# Patient Record
Sex: Female | Born: 1980 | Race: White | Hispanic: No | Marital: Married | Smoking: Current some day smoker
Health system: Southern US, Community
[De-identification: ages and names within clinical notes are randomized; demographics above are authoritative.]

## PROBLEM LIST (undated history)

## (undated) DIAGNOSIS — J45909 Unspecified asthma, uncomplicated: Secondary | ICD-10-CM

## (undated) DIAGNOSIS — Z9889 Other specified postprocedural states: Secondary | ICD-10-CM

## (undated) DIAGNOSIS — K219 Gastro-esophageal reflux disease without esophagitis: Secondary | ICD-10-CM

## (undated) DIAGNOSIS — R112 Nausea with vomiting, unspecified: Secondary | ICD-10-CM

## (undated) DIAGNOSIS — C801 Malignant (primary) neoplasm, unspecified: Secondary | ICD-10-CM

## (undated) HISTORY — PX: OTHER SURGICAL HISTORY: SHX169

## (undated) HISTORY — PX: TUBAL LIGATION: SHX77

## (undated) HISTORY — PX: BACK SURGERY: SHX140

## (undated) HISTORY — PX: BREAST ENHANCEMENT SURGERY: SHX7

---

## 2015-11-21 ENCOUNTER — Other Ambulatory Visit (HOSPITAL_COMMUNITY): Payer: Self-pay | Admitting: General Surgery

## 2015-11-30 ENCOUNTER — Other Ambulatory Visit: Payer: Self-pay

## 2015-11-30 ENCOUNTER — Ambulatory Visit (HOSPITAL_COMMUNITY)
Admission: RE | Admit: 2015-11-30 | Discharge: 2015-11-30 | Disposition: A | Discharge status: Home / Self Care | Payer: BLUE CROSS/BLUE SHIELD | Source: Ambulatory Visit | Attending: General Surgery | Admitting: General Surgery

## 2015-12-08 ENCOUNTER — Encounter: Payer: BLUE CROSS/BLUE SHIELD | Attending: General Surgery | Admitting: Dietician

## 2015-12-08 DIAGNOSIS — Z6841 Body Mass Index (BMI) 40.0 and over, adult: Secondary | ICD-10-CM | POA: Insufficient documentation

## 2015-12-08 NOTE — Progress Notes (Signed)
  Pre-Op Assessment Visit:  Pre-Operative Sleeve gastrectomy Surgery  Medical Nutrition Therapy:  Appt start time: 155   End time:  230.  Patient was seen on 12/08/2015 for Pre-Operative Nutrition Assessment. Assessment and letter of approval faxed to [facility] Surgery Bariatric Surgery Program coordinator on 12/08/2015.   Samples provided and patient instructed on proper use: Bariatric Advantage Calcium citrate chew (coconut - qty 2) Lot#: [id number] Exp: 02/2016  Bariatric Advantage Calcium citrate chew (caramel - qty 2) Lot#: [id number] Exp: 06/2016   Bariatric Advantage Calcium citrate chew (tropical orange - qty 2) Lot#: [id number] Exp: 03/2016  Bariatric Advantage Calcium citrate chew (chocolate - qty 2) Lot#: [id number] Exp: 06/2016  Bariatric Advantage Calcium citrate chew (strawberry - qty 2) Lot#: [id number] Exp: 07/2016  Bariatric Advantage Calcium citrate chew (chocolate peanut butter - qty 2) Lot#: [id number] Exp: 07/2016  Preferred Learning Style:   No preference indicated   Learning Readiness:   Ready  Handouts given during visit include:  Pre-Op Goals Bariatric Surgery Protein Shakes   During the appointment today the following Pre-Op Goals were reviewed with the patient: Maintain or lose weight as instructed by your surgeon Make healthy food choices Begin to limit portion sizes Limited concentrated sugars and fried foods Keep fat/sugar in the single digits per serving on   food labels Practice CHEWING your food  (aim for 30 chews per bite or until applesauce consistency) Practice not drinking 15 minutes before, during, and 30 minutes after each meal/snack Avoid all carbonated beverages  Avoid/limit caffeinated beverages  Avoid all sugar-sweetened beverages Consume 3 meals per day; eat every 3-5 hours Make a list of non-food related activities Aim for 64-100 ounces of FLUID daily  Aim for at least 60-80 grams of PROTEIN daily Look for a liquid protein  source that contain ?15 g protein and ?5 g carbohydrate  (ex: shakes, drinks, shots)  Patient-Centered Goals: -Feeling better -More energy and mobility -Reduced back pain  Scale of 1-10: confidence (8) /importance (10)  Demonstrated degree of understanding via:  Teach Back  Teaching Method Utilized:  Visual Auditory Hands on  Barriers to learning/adherence to lifestyle change: none  Patient to call the [facility] to enroll in Pre-Op and Post-Op Nutrition Education when surgery date is scheduled.

## 2015-12-08 NOTE — Patient Instructions (Signed)
Follow Pre-Op Goals Try Protein Shakes Call [facility] at [phone] when surgery is scheduled to enroll in Pre-Op Class  Things to remember:  Please always be honest with [place]. We want to support you!  If you have any questions or concerns in between appointments, please call or email Ferol Luz, or Margarita Grizzle.  The diet after surgery will be high protein and low in carbohydrate.  Vitamins and calcium need to be taken for the rest of your life.  Feel free to include support people in any classes or appointments.   Supplement recommendations:  "Complete" Multivitamin: Sleeve Gastrectomy and RYGB patients take a double dose of MVI. Vitamin must be liquid or chewable but not gummy. Examples of these include Flintstones Complete and Centrum Complete. If the vitamin is bariatric-specific, take 1 dose as it is already formulated for bariatric surgery patients. Examples of these are Bariatric Advantage, Celebrate, and [employer]. These can be found at the [facility] and/or online.     Calcium citrate: 1500 mg/day of Calcium citrate (also chewable or liquid) is recommended for all procedures. The body is only able to absorb 500-600 mg of Calcium at one time so 3 daily doses of 500 mg are recommended. Calcium doses must be taken a minimum of 2 hours apart. Additionally, Calcium must be taken 2 hours apart from iron-containing MVI. Examples of brands include Celebrate, Bariatric Advantage, and Wellesse. These brands must be purchased online or at the [facility]. Citracal Petites is the only Calcium citrate supplement found in general grocery stores and pharmacies. This is in tablet form and may be recommended for patients who do not tolerate chewable Calcium.  Continued or added Vitamin D supplementation based on individual needs.    Vitamin B12: 300-500 mcg/day for Sleeve Gastrectomy and RYGB. Must be taken intramuscularly, sublingually, or inhaled nasally. Oral route  is not recommended.

## 2015-12-09 ENCOUNTER — Ambulatory Visit: Payer: Self-pay | Admitting: Dietician

## 2016-01-05 ENCOUNTER — Encounter: Payer: BLUE CROSS/BLUE SHIELD | Attending: General Surgery | Admitting: Dietician

## 2016-01-05 ENCOUNTER — Encounter: Payer: Self-pay | Admitting: Dietician

## 2016-01-05 DIAGNOSIS — Z6841 Body Mass Index (BMI) 40.0 and over, adult: Secondary | ICD-10-CM | POA: Insufficient documentation

## 2016-01-05 NOTE — Patient Instructions (Signed)
-  Work on chewing well, eating slowly, and taking tiny bites -Practice eating at least 3x a day  -Start thinking about including a protein food with each meal and snack

## 2016-01-05 NOTE — Progress Notes (Signed)
Supervised weight loss:  Appt start time: 1030 end time:  1045  SWL visit 1:  Primary concerns today: [place] returns today for her 1st SWL visit in preparation for sleeve gastrectomy. She reports she was afraid she had gained weight because she stopped smoking 9 days ago. She also stopped drinking soda. Craving crunchy foods. Practicing eating regularly throughout the day and has been walking more. Trying to be mindful of eating slowly, taking tiny bites, and chewing thoroughly. Struggling not to drink while eating.    Weight: 237.5 lbs BMI: 40.9  Patient-Centered Goals: -Feeling better -More energy and mobility -Reduced back pain  Scale of 1-10: confidence (8) /importance (10)  MEDICATIONS: see list  DIETARY INTAKE:  24-hr recall:  B ( AM): eggs and bacon or sausage  Snk ( AM): fruit  L ( PM): crackers   Snk ( PM):  D ( PM): chicken, green beans, and corn or mac and cheese  Snk ( PM):   Beverages: water, sugar free Nature's Twist   Recent physical activity: walking 15 minutes 3x a week  Estimated energy needs: 1600-1800 calories  Progress Towards Goal(s):  In progress.   Nutritional Diagnosis:  [place]-3.3 Overweight/obesity related to past poor dietary habits and physical inactivity as evidenced by patient in SWL for pending bariatric surgery following dietary guidelines for continued weight loss.     Intervention:  Nutrition counseling provided.  Handouts given during visit include:  none  Monitoring/Evaluation:  Dietary intake, exercise, and body weight in 4 week(s).

## 2016-02-02 ENCOUNTER — Encounter: Payer: BLUE CROSS/BLUE SHIELD | Attending: General Surgery | Admitting: Dietician

## 2016-02-02 ENCOUNTER — Encounter: Payer: Self-pay | Admitting: Dietician

## 2016-02-02 DIAGNOSIS — Z6841 Body Mass Index (BMI) 40.0 and over, adult: Secondary | ICD-10-CM | POA: Insufficient documentation

## 2016-02-02 NOTE — Progress Notes (Signed)
Supervised weight loss:  Appt start time: 950 end time:  1005  SWL visit 2:  Primary concerns today: Tracie Johnson returns today for her 2nd SWL visit in preparation for sleeve gastrectomy. She has continued to avoid sodas, caffeine, and cigarettes. Still craving crunchy foods. Tried premier protein shake and likes the chocolate flavor; plans to try the clear version. Has been practicing chewing very thoroughly. Struggling to eat regularly throughout the day and has not been exercising as much due to busy schedule.   Weight: 238.5 lbs BMI: 41  Patient-Centered Goals: -Feeling better -More energy and mobility -Reduced back pain  Scale of 1-10: confidence (8) /importance (10)  MEDICATIONS: see list  DIETARY INTAKE:  24-hr recall:  B ( AM): eggs and bacon or sausage  Snk ( AM): fruit  L ( PM): crackers, pepperoni Snk ( PM):  D ( PM): chicken, green beans, and corn or mac and cheese  Snk ( PM):   Beverages: water, sugar free Nature's Twist   Recent physical activity: walking 15 minutes 3x a week  Estimated energy needs: 1600-1800 calories  Progress Towards Goal(s):  In progress.   Nutritional Diagnosis:  [place]-3.3 Overweight/obesity related to past poor dietary habits and physical inactivity as evidenced by patient in SWL for pending bariatric surgery following dietary guidelines for continued weight loss.     Intervention:  Nutrition counseling provided.  Goals: -Work on chewing well, eating slowly, and taking tiny bites -Practice eating at least 3x a day  -Start thinking about including a protein food with each meal and snack  -Try Quest protein chips and parmesan wisps for a crunch -Practice getting into the mindset of diet after surgery  Handouts given during visit include:  none  Monitoring/Evaluation:  Dietary intake, exercise, and body weight in 4 week(s).

## 2016-02-02 NOTE — Patient Instructions (Addendum)
-  Work on chewing well, eating slowly, and taking tiny bites -Practice eating at least 3x a day  -Start thinking about including a protein food with each meal and snack   -Try Quest protein chips and parmesan wisps for a crunch  -Practice getting into the mindset of diet after surgery

## 2016-03-01 ENCOUNTER — Encounter: Payer: Self-pay | Admitting: Dietician

## 2016-03-01 ENCOUNTER — Encounter: Payer: BLUE CROSS/BLUE SHIELD | Attending: General Surgery | Admitting: Dietician

## 2016-03-01 DIAGNOSIS — Z6841 Body Mass Index (BMI) 40.0 and over, adult: Secondary | ICD-10-CM | POA: Insufficient documentation

## 2016-03-01 NOTE — Progress Notes (Signed)
Supervised weight loss:  Appt start time: 1010 end time:  1025  SWL visit 3:  Primary concerns today: Tracie Johnson returns today for her 3rd SWL visit in preparation for sleeve gastrectomy having gained 2 pounds. States she has been struggling with constipation. She has continued to avoid caffeine and sodas. Has not yet tried Premier clear protein shakes. Continues to practice chewing thoroughly. Has been vacationing and has not been cooking as much.   Weight: 240.8 lbs BMI: 41.4  Patient-Centered Goals: -Feeling better -More energy and mobility -Reduced back pain  Scale of 1-10: confidence (8) /importance (10)  MEDICATIONS: see list  DIETARY INTAKE:  24-hr recall:  B ( AM): eggs and bacon or sausage  Snk ( AM): fruit  L ( PM): crackers, pepperoni Snk ( PM):  D ( PM): chicken, green beans, and corn or mac and cheese  Snk ( PM):   Beverages: water, sugar free Nature's Twist   Recent physical activity: walking 15 minutes 3x a week  Estimated energy needs: 1600-1800 calories  Progress Towards Goal(s):  In progress.   Nutritional Diagnosis:  [place]-3.3 Overweight/obesity related to past poor dietary habits and physical inactivity as evidenced by patient in SWL for pending bariatric surgery following dietary guidelines for continued weight loss.     Intervention:  Nutrition counseling provided.  Goals: -Work on chewing well, eating slowly, and taking tiny bites -Practice eating at least 3x a day  -Start thinking about including a protein food with each meal and snack  -Try Quest protein chips and parmesan wisps for a crunch -Practice getting into the mindset of diet after surgery  Handouts given during visit include:  none  Monitoring/Evaluation:  Dietary intake, exercise, and body weight in 4 week(s).

## 2016-03-01 NOTE — Patient Instructions (Addendum)
-  Work on chewing well, eating slowly, and taking tiny bites -Practice eating at least 3x a day  -Start thinking about including a protein food with each meal and snack   -Try Quest protein chips and parmesan wisps for a crunch  -Work on drinking more water  -Go for an actual walk on [employer] 2 times on vacation  -Practice getting into the mindset of diet after surgery

## 2016-03-28 ENCOUNTER — Encounter: Payer: Self-pay | Admitting: Dietician

## 2016-03-28 ENCOUNTER — Encounter: Payer: BLUE CROSS/BLUE SHIELD | Attending: General Surgery | Admitting: Dietician

## 2016-03-28 DIAGNOSIS — Z6841 Body Mass Index (BMI) 40.0 and over, adult: Secondary | ICD-10-CM | POA: Insufficient documentation

## 2016-03-28 NOTE — Progress Notes (Signed)
Supervised weight loss:  Appt start time: 1045 end time:  1100  SWL visit 4:  Primary concerns today: Tracie Johnson returns today for her 4th SWL visit in preparation for sleeve gastrectomy having maintained her weight. Has had a death in the family and went on vacation in the last month. However, she has not had a cigarette! She states that she walked a lot on vacation. Has continued to drink a lot of water. Has not yet tried a protein shake.  Weight: 240.3 lbs BMI: 41.4  Patient-Centered Goals: -Feeling better -More energy and mobility -Reduced back pain  Scale of 1-10: confidence (8) /importance (10)  MEDICATIONS: see list  DIETARY INTAKE:  24-hr recall:  B ( AM): eggs and bacon or sausage  Snk ( AM): fruit  L ( PM): crackers, pepperoni Snk ( PM):  D ( PM): chicken, green beans, and corn or mac and cheese  Snk ( PM):   Beverages: water, sugar free Nature's Twist   Recent physical activity: walking 15 minutes 3x a week  Estimated energy needs: 1600-1800 calories  Progress Towards Goal(s):  In progress.   Nutritional Diagnosis:  [place]-3.3 Overweight/obesity related to past poor dietary habits and physical inactivity as evidenced by patient in SWL for pending bariatric surgery following dietary guidelines for continued weight loss.     Intervention:  Nutrition counseling provided.  Goals: -Work on chewing well, eating slowly, and taking tiny bites -Practice eating at least 3x a day  -Start thinking about including a protein food with each meal and snack  -Try Quest protein chips and parmesan wisps for a crunch -Practice getting into the mindset of diet after surgery -Eliminate caffeine -Practice eating more slowly and CHEW! -Try an approved protein shake  Handouts given during visit include:  none  Monitoring/Evaluation:  Dietary intake, exercise, and body weight in 4 week(s).

## 2016-03-28 NOTE — Patient Instructions (Addendum)
-  Work on chewing well, eating slowly, and taking tiny bites -Practice eating at least 3x a day  -Start thinking about including a protein food with each meal and snack  -Try Quest protein chips and parmesan wisps for a crunch -Work on drinking more water -[place] -Practice getting into the mindset of diet after surgery  -Eliminate caffeine  -Practice eating more slowly and CHEW!  -Try an approved protein shake

## 2016-03-29 ENCOUNTER — Ambulatory Visit: Payer: BLUE CROSS/BLUE SHIELD | Admitting: Dietician

## 2016-04-30 ENCOUNTER — Encounter: Payer: BLUE CROSS/BLUE SHIELD | Attending: General Surgery | Admitting: Dietician

## 2016-04-30 ENCOUNTER — Encounter: Payer: Self-pay | Admitting: Dietician

## 2016-04-30 DIAGNOSIS — Z6841 Body Mass Index (BMI) 40.0 and over, adult: Secondary | ICD-10-CM | POA: Insufficient documentation

## 2016-04-30 NOTE — Progress Notes (Signed)
Supervised weight loss:  Appt start time: 1025 end time:  1040  SWL visit 5:  Primary concerns today: Tracie Johnson returns today for her final SWL visit in preparation for sleeve gastrectomy having gained 2 pounds. She is still struggling to chew thoroughly and make this a habit. She continues to avoid cigarettes and caffeine. She likes lean protein foods and is prepared for the post op diet. She prefers to cook rather than eat out. She feels like eggs will be her go-to after surgery if she does not want to eat what she makes for her family. Tried Premier protein shake and likes it. Still working on not drinking with meals.    Weight: 242.3 lbs BMI: 41.6  Patient-Centered Goals: -Feeling better -More energy and mobility -Reduced back pain  Scale of 1-10: confidence (8) /importance (10)  MEDICATIONS: see list  DIETARY INTAKE:  24-hr recall:  B ( AM): eggs and bacon or sausage  Snk ( AM): fruit  L ( PM): crackers, pepperoni Snk ( PM):  D ( PM): chicken, green beans, and corn or mac and cheese  Snk ( PM):   Beverages: water, sugar free Nature's Twist   Recent physical activity: walking 15 minutes 3x a week  Estimated energy needs: 1600-1800 calories  Progress Towards Goal(s):  In progress.   Nutritional Diagnosis:  [place]-3.3 Overweight/obesity related to past poor dietary habits and physical inactivity as evidenced by patient in SWL for pending bariatric surgery following dietary guidelines for continued weight loss.     Intervention:  Nutrition counseling provided.  Goals: -Work on chewing well, eating slowly, and taking tiny bites -Practice eating at least 3x a day  -Start thinking about including a protein food with each meal and snack  -Try Quest protein chips and parmesan wisps for a crunch -Practice getting into the mindset of diet after surgery -Eliminate caffeine -Practice eating more slowly and CHEW! -Try an approved protein shake  Handouts given during visit  include:  none  Monitoring/Evaluation:  Dietary intake, exercise, and body weight in 4 week(s).

## 2016-05-07 ENCOUNTER — Encounter: Payer: BLUE CROSS/BLUE SHIELD | Admitting: Dietician

## 2016-05-08 NOTE — Progress Notes (Signed)
Pt coming in for pre op-  PLEASE PLACE PRE OP ORDERS IN EPIC  THANKS

## 2016-05-09 ENCOUNTER — Encounter: Payer: Self-pay | Admitting: Dietician

## 2016-05-09 NOTE — Progress Notes (Signed)
  Pre-Operative Nutrition Class:  Appt start time: 2774   End time:  1830.  Patient was seen on 05/07/2016 for Pre-Operative Bariatric Surgery Education at [facility].   Surgery date:  Surgery type: Sleeve Gastrectomy [facility] weight at [facility]: 239 lbs on 12/08/2015 Weight today: 240.8 lbs  TANITA  BODY COMP RESULTS  05/09/16   BMI (kg/m^2) 41.3   Fat Mass (lbs) 124.4   Fat Free Mass (lbs) 166.4   Total Body Water (lbs) 85.6   Samples given per MNT protocol. Patient educated on appropriate usage: Premier protein shake (chocolate - qty 1) Lot #: [id number] Exp: 02/2017  Bariatric Advantage Multivitamin chew (mixed fruit - qty 1) Lot #: [id number] Exp: 02/2017  Bariatric Advantage Calcium Citrate chew (strawberry - qty 1) Lot #: [id number] Exp: 12/2016  Unjury Protein Powder (vanilla - qty 1) Lot #: [id number] Exp: 08/2017  The following the learning objectives were met by the patient during this course:  Identify Pre-Op Dietary Goals and will begin 2 weeks pre-operatively  Identify appropriate sources of fluids and proteins   State protein recommendations and appropriate sources pre and post-operatively  Identify Post-Operative Dietary Goals and will follow for 2 weeks post-operatively  Identify appropriate multivitamin and calcium sources  Describe the need for physical activity post-operatively and will follow MD recommendations  State when to call healthcare provider regarding medication questions or post-operative complications  Handouts given during class include:  Pre-Op Bariatric Surgery Diet Handout  Protein Shake Handout  Post-Op Bariatric Surgery Nutrition Handout  BELT Program Information Flyer  Support Group Information Flyer  [facility] Bariatric Supplements Price List  Follow-Up Plan: Patient will follow-up at [facility] 2 weeks post operatively for diet advancement per MD.

## 2016-05-10 ENCOUNTER — Ambulatory Visit: Payer: Self-pay | Admitting: General Surgery

## 2016-05-10 NOTE — Progress Notes (Signed)
DR Dola Factor add orders in epic-- has pre op 05/15/16

## 2016-05-11 NOTE — Patient Instructions (Signed)
Tracie Johnson  05/11/2016   Your procedure is scheduled on: 05/21/2016    Report to [facility]  Entrance take [place]  elevators to 3rd floor to  [place] at   0830 AM.  Call this number if you have problems the morning of surgery [phone]   Remember: ONLY 1 PERSON MAY GO WITH YOU TO SHORT STAY TO GET  READY MORNING OF [place].  Do not eat food or drink liquids :After Midnight.     Take these medicines the morning of surgery with A SIP OF WATER:  DO NOT TAKE ANY DIABETIC MEDICATIONS DAY OF YOUR SURGERY                               You may not have any metal on your body including hair pins and              piercings  Do not wear jewelry, make-up, lotions, powders or perfumes, deodorant             Do not wear nail polish.  Do not shave  48 hours prior to surgery.            Do not bring valuables to the hospital. [place].  Contacts, dentures or bridgework may not be worn into surgery.  Leave suitcase in the car. After surgery it may be brought to your room.       Special Instructions: N/A              Please read over the following fact sheets you were given: _____________________________________________________________________             [facility] - Preparing for Surgery Before surgery, you can play an important role.  Because skin is not sterile, your skin needs to be as free of germs as possible.  You can reduce the number of germs on your skin by washing with CHG (chlorahexidine gluconate) soap before surgery.  CHG is an antiseptic cleaner which kills germs and bonds with the skin to continue killing germs even after washing. Please DO NOT use if you have an allergy to CHG or antibacterial soaps.  If your skin becomes reddened/irritated stop using the CHG and inform your nurse when you arrive at Short Stay. Do not shave (including legs and underarms) for at least 48 hours prior  to the first CHG shower.  You may shave your face/neck. Please follow these instructions carefully:  1.  Shower with CHG Soap the night before surgery and the  morning of Surgery.  2.  If you choose to wash your hair, wash your hair first as usual with your  normal  shampoo.  3.  After you shampoo, rinse your hair and body thoroughly to remove the  shampoo.                           4.  Use CHG as you would any other liquid soap.  You can apply chg directly  to the skin and wash                       Gently with a scrungie or clean washcloth.  5.  Apply the CHG Soap to your body ONLY FROM THE NECK DOWN.   Do not use on face/ open                           Wound or open sores. Avoid contact with eyes, ears mouth and genitals (private parts).                       Wash face,  Genitals (private parts) with your normal soap.             6.  Wash thoroughly, paying special attention to the area where your surgery  will be performed.  7.  Thoroughly rinse your body with warm water from the neck down.  8.  DO NOT shower/wash with your normal soap after using and rinsing off  the CHG Soap.                9.  Pat yourself dry with a clean towel.            10.  Wear clean pajamas.            11.  Place clean sheets on your bed the night of your first shower and do not  sleep with pets. Day of Surgery : Do not apply any lotions/deodorants the morning of surgery.  Please wear clean clothes to the hospital/surgery center.  FAILURE TO FOLLOW THESE INSTRUCTIONS MAY RESULT IN THE CANCELLATION OF YOUR SURGERY PATIENT SIGNATURE_________________________________  NURSE SIGNATURE__________________________________  ________________________________________________________________________

## 2016-05-15 ENCOUNTER — Encounter (HOSPITAL_COMMUNITY): Payer: Self-pay

## 2016-05-15 ENCOUNTER — Encounter (HOSPITAL_COMMUNITY)
Admission: RE | Admit: 2016-05-15 | Discharge: 2016-05-15 | Disposition: A | Discharge status: Home / Self Care | Payer: BLUE CROSS/BLUE SHIELD | Source: Ambulatory Visit | Attending: General Surgery | Admitting: General Surgery

## 2016-05-15 DIAGNOSIS — Z01812 Encounter for preprocedural laboratory examination: Secondary | ICD-10-CM | POA: Diagnosis present

## 2016-05-15 DIAGNOSIS — Z6841 Body Mass Index (BMI) 40.0 and over, adult: Secondary | ICD-10-CM | POA: Insufficient documentation

## 2016-05-15 HISTORY — DX: Other specified postprocedural states: Z98.890

## 2016-05-15 HISTORY — DX: Nausea with vomiting, unspecified: R11.2

## 2016-05-15 HISTORY — DX: Gastro-esophageal reflux disease without esophagitis: K21.9

## 2016-05-15 HISTORY — DX: Unspecified asthma, uncomplicated: J45.909

## 2016-05-15 HISTORY — DX: Malignant (primary) neoplasm, unspecified: C80.1

## 2016-05-15 LAB — COMPREHENSIVE METABOLIC PANEL
ALBUMIN: 3.7 g/dL (ref 3.5–5.0)
ALT: 41 U/L (ref 14–54)
ANION GAP: 8 (ref 5–15)
AST: 39 U/L (ref 15–41)
Alkaline Phosphatase: 94 U/L (ref 38–126)
BILIRUBIN TOTAL: 0.8 mg/dL (ref 0.3–1.2)
BUN: 20 mg/dL (ref 6–20)
CHLORIDE: 101 mmol/L (ref 101–111)
CO2: 28 mmol/L (ref 22–32)
Calcium: 9.6 mg/dL (ref 8.9–10.3)
Creatinine, Ser: 0.82 mg/dL (ref 0.44–1.00)
GFR calc Af Amer: >60 mL/min (ref >60)
GFR calc non Af Amer: >60 mL/min (ref >60)
GLUCOSE: 81 mg/dL (ref 65–99)
POTASSIUM: 3.9 mmol/L (ref 3.5–5.1)
SODIUM: 137 mmol/L (ref 135–145)
TOTAL PROTEIN: 7.5 g/dL (ref 6.5–8.1)

## 2016-05-15 LAB — CBC WITH DIFFERENTIAL/PLATELET
BASOS ABS: 0 10*3/uL (ref 0.0–0.1)
Basophils Relative: 0 %
Eosinophils Absolute: 0.1 10*3/uL (ref 0.0–0.7)
Eosinophils Relative: 1 %
HEMATOCRIT: 40.7 % (ref 36.0–46.0)
Hemoglobin: 13.9 g/dL (ref 12.0–15.0)
LYMPHS PCT: 20 %
Lymphs Abs: 1.5 10*3/uL (ref 0.7–4.0)
MCH: 29.2 pg (ref 26.0–34.0)
MCHC: 34.2 g/dL (ref 30.0–36.0)
MCV: 85.5 fL (ref 78.0–100.0)
MONO ABS: 0.5 10*3/uL (ref 0.1–1.0)
MONOS PCT: 7 %
NEUTROS ABS: 5.4 10*3/uL (ref 1.7–7.7)
Neutrophils Relative %: 72 %
PLATELETS: 256 10*3/uL (ref 150–400)
RBC: 4.76 MIL/uL (ref 3.87–5.11)
RDW: 12.8 % (ref 11.5–15.5)
WBC: 7.6 10*3/uL (ref 4.0–10.5)

## 2016-05-15 NOTE — Progress Notes (Signed)
EKG and CXR- 11/30/15- EPIC

## 2016-05-21 ENCOUNTER — Encounter (HOSPITAL_COMMUNITY)
Admission: RE | Disposition: A | Discharge status: Home / Self Care | Payer: Self-pay | Source: Ambulatory Visit | Attending: General Surgery

## 2016-05-21 ENCOUNTER — Inpatient Hospital Stay (HOSPITAL_COMMUNITY)
Admission: RE | Admit: 2016-05-21 | Discharge: 2016-05-22 | DRG: 621 | Disposition: A | Discharge status: Home / Self Care | Payer: BLUE CROSS/BLUE SHIELD | Source: Ambulatory Visit | Attending: General Surgery | Admitting: General Surgery

## 2016-05-21 ENCOUNTER — Encounter (HOSPITAL_COMMUNITY): Payer: Self-pay | Admitting: *Deleted

## 2016-05-21 ENCOUNTER — Inpatient Hospital Stay (HOSPITAL_COMMUNITY): Payer: BLUE CROSS/BLUE SHIELD | Admitting: Anesthesiology

## 2016-05-21 DIAGNOSIS — M545 Low back pain, unspecified: Secondary | ICD-10-CM | POA: Diagnosis present

## 2016-05-21 DIAGNOSIS — K219 Gastro-esophageal reflux disease without esophagitis: Secondary | ICD-10-CM | POA: Diagnosis present

## 2016-05-21 DIAGNOSIS — Z9884 Bariatric surgery status: Secondary | ICD-10-CM

## 2016-05-21 DIAGNOSIS — G8929 Other chronic pain: Secondary | ICD-10-CM | POA: Diagnosis present

## 2016-05-21 DIAGNOSIS — N393 Stress incontinence (female) (male): Secondary | ICD-10-CM | POA: Diagnosis present

## 2016-05-21 DIAGNOSIS — E282 Polycystic ovarian syndrome: Secondary | ICD-10-CM | POA: Diagnosis present

## 2016-05-21 DIAGNOSIS — Z87898 Personal history of other specified conditions: Secondary | ICD-10-CM

## 2016-05-21 DIAGNOSIS — N39490 Overflow incontinence: Secondary | ICD-10-CM

## 2016-05-21 DIAGNOSIS — Z6841 Body Mass Index (BMI) 40.0 and over, adult: Secondary | ICD-10-CM | POA: Diagnosis not present

## 2016-05-21 DIAGNOSIS — Z87891 Personal history of nicotine dependence: Secondary | ICD-10-CM

## 2016-05-21 DIAGNOSIS — E66813 Obesity, class 3: Secondary | ICD-10-CM | POA: Diagnosis present

## 2016-05-21 HISTORY — PX: LAPAROSCOPIC GASTRIC SLEEVE RESECTION: SHX5895

## 2016-05-21 LAB — PREGNANCY, URINE: Preg Test, Ur: NEGATIVE

## 2016-05-21 LAB — HEMOGLOBIN AND HEMATOCRIT, BLOOD
HCT: 36.5 % (ref 36.0–46.0)
HEMOGLOBIN: 12.5 g/dL (ref 12.0–15.0)

## 2016-05-21 SURGERY — GASTRECTOMY, SLEEVE, LAPAROSCOPIC
Anesthesia: General | Site: Abdomen

## 2016-05-21 MED ORDER — PROMETHAZINE HCL 25 MG/ML IJ SOLN
6.2500 mg | INTRAMUSCULAR | Status: DC | PRN
  Administered 2016-05-21: 12.5 mg via INTRAVENOUS

## 2016-05-21 MED ORDER — LIDOCAINE 2% (20 MG/ML) 5 ML SYRINGE
INTRAMUSCULAR | Status: AC
  Filled 2016-05-21: qty 5

## 2016-05-21 MED ORDER — PROMETHAZINE HCL 25 MG/ML IJ SOLN
12.5000 mg | Freq: Four times a day (QID) | INTRAMUSCULAR | Status: DC | PRN
  Administered 2016-05-21: 12.5 mg via INTRAVENOUS

## 2016-05-21 MED ORDER — CEFOTETAN DISODIUM-DEXTROSE 2-2.08 GM-% IV SOLR
INTRAVENOUS | Status: AC
  Filled 2016-05-21: qty 50

## 2016-05-21 MED ORDER — SODIUM CHLORIDE 0.9 % IJ SOLN
INTRAMUSCULAR | Status: AC
  Filled 2016-05-21: qty 50

## 2016-05-21 MED ORDER — MIDAZOLAM HCL 2 MG/2ML IJ SOLN
INTRAMUSCULAR | Status: AC
  Filled 2016-05-21: qty 2

## 2016-05-21 MED ORDER — MORPHINE SULFATE (PF) 10 MG/ML IV SOLN
2.0000 mg | INTRAVENOUS | Status: DC | PRN
  Administered 2016-05-21 (×2): 4 mg via INTRAVENOUS
  Administered 2016-05-21: 2 mg via INTRAVENOUS
  Administered 2016-05-21: 4 mg via INTRAVENOUS
  Administered 2016-05-22: 6 mg via INTRAVENOUS
  Administered 2016-05-22: 4 mg via INTRAVENOUS
  Filled 2016-05-21 (×6): qty 1

## 2016-05-21 MED ORDER — EPHEDRINE 5 MG/ML INJ
INTRAVENOUS | Status: AC
  Filled 2016-05-21: qty 10

## 2016-05-21 MED ORDER — ACETAMINOPHEN 10 MG/ML IV SOLN
INTRAVENOUS | Status: DC | PRN
  Administered 2016-05-21: 1000 mg via INTRAVENOUS

## 2016-05-21 MED ORDER — EPHEDRINE SULFATE-NACL 50-0.9 MG/10ML-% IV SOSY
PREFILLED_SYRINGE | INTRAVENOUS | Status: DC | PRN
  Administered 2016-05-21: 10 mg via INTRAVENOUS

## 2016-05-21 MED ORDER — ROCURONIUM BROMIDE 10 MG/ML (PF) SYRINGE
PREFILLED_SYRINGE | INTRAVENOUS | Status: AC
  Filled 2016-05-21: qty 10

## 2016-05-21 MED ORDER — SODIUM CHLORIDE 0.9 % IJ SOLN
INTRAMUSCULAR | Status: DC | PRN
  Administered 2016-05-21: 50 mL

## 2016-05-21 MED ORDER — MIDAZOLAM HCL 5 MG/5ML IJ SOLN
INTRAMUSCULAR | Status: DC | PRN
  Administered 2016-05-21: 2 mg via INTRAVENOUS

## 2016-05-21 MED ORDER — HEPARIN SODIUM (PORCINE) 5000 UNIT/ML IJ SOLN
5000.0000 [IU] | INTRAMUSCULAR | Status: AC
  Administered 2016-05-21: 5000 [IU] via SUBCUTANEOUS
  Filled 2016-05-21: qty 1

## 2016-05-21 MED ORDER — FENTANYL CITRATE (PF) 100 MCG/2ML IJ SOLN
INTRAMUSCULAR | Status: AC
  Filled 2016-05-21: qty 2

## 2016-05-21 MED ORDER — PROPOFOL 10 MG/ML IV BOLUS
INTRAVENOUS | Status: AC
  Filled 2016-05-21: qty 20

## 2016-05-21 MED ORDER — PREMIER PROTEIN SHAKE: 2.0000 [oz_av] | ORAL | Status: DC

## 2016-05-21 MED ORDER — PROMETHAZINE HCL 25 MG/ML IJ SOLN
INTRAMUSCULAR | Status: AC
  Filled 2016-05-21: qty 1

## 2016-05-21 MED ORDER — ORAL CARE MOUTH RINSE
15.0000 mL | Freq: Two times a day (BID) | OROMUCOSAL | Status: DC
  Administered 2016-05-21 (×2): 15 mL via OROMUCOSAL

## 2016-05-21 MED ORDER — SCOPOLAMINE 1 MG/3DAYS TD PT72
1.0000 | MEDICATED_PATCH | TRANSDERMAL | Status: DC
  Administered 2016-05-21: 1.5 mg via TRANSDERMAL

## 2016-05-21 MED ORDER — PROPOFOL 10 MG/ML IV BOLUS
INTRAVENOUS | Status: DC | PRN
  Administered 2016-05-21: 200 mg via INTRAVENOUS

## 2016-05-21 MED ORDER — APREPITANT 40 MG PO CAPS
40.0000 mg | ORAL_CAPSULE | ORAL | Status: AC
  Administered 2016-05-21: 40 mg via ORAL
  Filled 2016-05-21: qty 1

## 2016-05-21 MED ORDER — DEXAMETHASONE SODIUM PHOSPHATE 10 MG/ML IJ SOLN
INTRAMUSCULAR | Status: DC | PRN
  Administered 2016-05-21: 10 mg via INTRAVENOUS

## 2016-05-21 MED ORDER — ONDANSETRON HCL 4 MG/2ML IJ SOLN
INTRAMUSCULAR | Status: DC | PRN
  Administered 2016-05-21: 4 mg via INTRAVENOUS

## 2016-05-21 MED ORDER — ACETAMINOPHEN 10 MG/ML IV SOLN
1000.0000 mg | Freq: Four times a day (QID) | INTRAVENOUS | Status: AC
  Administered 2016-05-21 – 2016-05-22 (×4): 1000 mg via INTRAVENOUS
  Filled 2016-05-21 (×4): qty 100

## 2016-05-21 MED ORDER — FENTANYL CITRATE (PF) 250 MCG/5ML IJ SOLN
INTRAMUSCULAR | Status: AC
  Filled 2016-05-21: qty 5

## 2016-05-21 MED ORDER — FENTANYL CITRATE (PF) 100 MCG/2ML IJ SOLN
INTRAMUSCULAR | Status: DC | PRN
  Administered 2016-05-21: 25 ug via INTRAVENOUS
  Administered 2016-05-21 (×2): 50 ug via INTRAVENOUS
  Administered 2016-05-21: 25 ug via INTRAVENOUS
  Administered 2016-05-21 (×4): 50 ug via INTRAVENOUS

## 2016-05-21 MED ORDER — BUPIVACAINE LIPOSOME 1.3 % IJ SUSP
20.0000 mL | Freq: Once | INTRAMUSCULAR | Status: AC
  Administered 2016-05-21: 20 mL
  Filled 2016-05-21: qty 20

## 2016-05-21 MED ORDER — LACTATED RINGERS IR SOLN
Status: DC | PRN
  Administered 2016-05-21: 1000 mL

## 2016-05-21 MED ORDER — DIPHENHYDRAMINE HCL 50 MG/ML IJ SOLN: 12.5000 mg | Freq: Three times a day (TID) | INTRAMUSCULAR | Status: DC | PRN

## 2016-05-21 MED ORDER — ACETAMINOPHEN 160 MG/5ML PO SOLN: 650.0000 mg | ORAL | Status: DC | PRN

## 2016-05-21 MED ORDER — LACTATED RINGERS IV SOLN
INTRAVENOUS | Status: DC
  Administered 2016-05-21 (×2): via INTRAVENOUS

## 2016-05-21 MED ORDER — FENTANYL CITRATE (PF) 100 MCG/2ML IJ SOLN
25.0000 ug | INTRAMUSCULAR | Status: DC | PRN
  Administered 2016-05-21 (×3): 50 ug via INTRAVENOUS

## 2016-05-21 MED ORDER — CEFOTETAN DISODIUM-DEXTROSE 2-2.08 GM-% IV SOLR
2.0000 g | INTRAVENOUS | Status: AC
  Administered 2016-05-21: 2 g via INTRAVENOUS

## 2016-05-21 MED ORDER — SCOPOLAMINE 1 MG/3DAYS TD PT72
MEDICATED_PATCH | TRANSDERMAL | Status: AC
  Filled 2016-05-21: qty 1

## 2016-05-21 MED ORDER — LIDOCAINE 2% (20 MG/ML) 5 ML SYRINGE
INTRAMUSCULAR | Status: DC | PRN
  Administered 2016-05-21: 100 mg via INTRAVENOUS

## 2016-05-21 MED ORDER — ROCURONIUM BROMIDE 10 MG/ML (PF) SYRINGE
PREFILLED_SYRINGE | INTRAVENOUS | Status: DC | PRN
  Administered 2016-05-21: 45 mg via INTRAVENOUS
  Administered 2016-05-21: 5 mg via INTRAVENOUS

## 2016-05-21 MED ORDER — ENOXAPARIN SODIUM 30 MG/0.3ML ~~LOC~~ SOLN
30.0000 mg | Freq: Two times a day (BID) | SUBCUTANEOUS | Status: DC
  Administered 2016-05-22: 30 mg via SUBCUTANEOUS
  Filled 2016-05-21: qty 0.3

## 2016-05-21 MED ORDER — KCL IN DEXTROSE-NACL 20-5-0.45 MEQ/L-%-% IV SOLN
INTRAVENOUS | Status: DC
  Administered 2016-05-21 – 2016-05-22 (×3): via INTRAVENOUS
  Filled 2016-05-21 (×4): qty 1000

## 2016-05-21 MED ORDER — ONDANSETRON HCL 4 MG/2ML IJ SOLN: 4.0000 mg | INTRAMUSCULAR | Status: DC | PRN

## 2016-05-21 MED ORDER — OXYCODONE HCL 5 MG/5ML PO SOLN
5.0000 mg | ORAL | Status: DC | PRN
  Administered 2016-05-22 (×2): 5 mg via ORAL
  Filled 2016-05-21 (×2): qty 5

## 2016-05-21 MED ORDER — DEXAMETHASONE SODIUM PHOSPHATE 10 MG/ML IJ SOLN
INTRAMUSCULAR | Status: AC
  Filled 2016-05-21: qty 1

## 2016-05-21 MED ORDER — ACETAMINOPHEN 10 MG/ML IV SOLN
INTRAVENOUS | Status: AC
  Filled 2016-05-21: qty 100

## 2016-05-21 MED ORDER — STERILE WATER FOR IRRIGATION IR SOLN
Status: DC | PRN
  Administered 2016-05-21: 1000 mL

## 2016-05-21 MED ORDER — 0.9 % SODIUM CHLORIDE (POUR BTL) OPTIME
TOPICAL | Status: DC | PRN
  Administered 2016-05-21: 1000 mL

## 2016-05-21 MED ORDER — SUCCINYLCHOLINE CHLORIDE 200 MG/10ML IV SOSY
PREFILLED_SYRINGE | INTRAVENOUS | Status: DC | PRN
  Administered 2016-05-21: 100 mg via INTRAVENOUS

## 2016-05-21 MED ORDER — ONDANSETRON HCL 4 MG/2ML IJ SOLN
INTRAMUSCULAR | Status: AC
  Filled 2016-05-21: qty 2

## 2016-05-21 MED ORDER — ACETAMINOPHEN 160 MG/5ML PO SOLN: 325.0000 mg | ORAL | Status: DC | PRN

## 2016-05-21 MED ORDER — FAMOTIDINE IN NACL 20-0.9 MG/50ML-% IV SOLN
20.0000 mg | Freq: Two times a day (BID) | INTRAVENOUS | Status: DC
  Administered 2016-05-21 – 2016-05-22 (×2): 20 mg via INTRAVENOUS
  Filled 2016-05-21 (×2): qty 50

## 2016-05-21 SURGICAL SUPPLY — 61 items
APPLICATOR COTTON TIP 6IN STRL (MISCELLANEOUS) IMPLANT
APPLIER CLIP ROT 10 11.4 M/L (STAPLE)
BLADE SURG SZ11 CARB STEEL (BLADE) ×2 IMPLANT
CABLE HIGH FREQUENCY MONO STRZ (ELECTRODE) ×2 IMPLANT
CHLORAPREP W/TINT 26ML (MISCELLANEOUS) ×4 IMPLANT
CLIP APPLIE ROT 10 11.4 M/L (STAPLE) IMPLANT
COVER SURGICAL LIGHT HANDLE (MISCELLANEOUS) IMPLANT
DECANTER SPIKE VIAL GLASS SM (MISCELLANEOUS) ×2 IMPLANT
DERMABOND ADVANCED (GAUZE/BANDAGES/DRESSINGS) ×1
DERMABOND ADVANCED .7 DNX12 (GAUZE/BANDAGES/DRESSINGS) ×1 IMPLANT
DEVICE SUT QUICK LOAD TK 5 (STAPLE) IMPLANT
DEVICE SUT TI-KNOT TK 5X26 (MISCELLANEOUS) IMPLANT
DEVICE SUTURE ENDOST 10MM (ENDOMECHANICALS) IMPLANT
DEVICE TROCAR PUNCTURE CLOSURE (ENDOMECHANICALS) IMPLANT
DRAPE UTILITY XL STRL (DRAPES) ×4 IMPLANT
ELECT L-HOOK LAP 45CM DISP (ELECTROSURGICAL)
ELECT PENCIL ROCKER SW 15FT (MISCELLANEOUS) IMPLANT
ELECT REM PT RETURN 9FT ADLT (ELECTROSURGICAL) ×2
ELECTRODE L-HOOK LAP 45CM DISP (ELECTROSURGICAL) IMPLANT
ELECTRODE REM PT RTRN 9FT ADLT (ELECTROSURGICAL) ×1 IMPLANT
GAUZE SPONGE 4X4 12PLY STRL (GAUZE/BANDAGES/DRESSINGS) IMPLANT
GLOVE BIO SURGEON STRL SZ7.5 (GLOVE) ×2 IMPLANT
GLOVE INDICATOR 8.0 STRL GRN (GLOVE) ×2 IMPLANT
GOWN STRL REUS W/TWL XL LVL3 (GOWN DISPOSABLE) ×6 IMPLANT
HOVERMATT SINGLE USE (MISCELLANEOUS) ×2 IMPLANT
IRRIG SUCT STRYKERFLOW 2 WTIP (MISCELLANEOUS) ×2
IRRIGATION SUCT STRKRFLW 2 WTP (MISCELLANEOUS) ×1 IMPLANT
KIT BASIN OR (CUSTOM PROCEDURE TRAY) ×2 IMPLANT
MARKER SKIN DUAL TIP RULER LAB (MISCELLANEOUS) ×2 IMPLANT
NEEDLE SPNL 22GX3.5 QUINCKE BK (NEEDLE) ×2 IMPLANT
PACK UNIVERSAL I (CUSTOM PROCEDURE TRAY) ×2 IMPLANT
RELOAD STAPLER BLUE 60MM (STAPLE) ×2 IMPLANT
RELOAD STAPLER GOLD 60MM (STAPLE) ×1 IMPLANT
RELOAD STAPLER GREEN 60MM (STAPLE) ×2 IMPLANT
SCISSORS LAP 5X45 EPIX DISP (ENDOMECHANICALS) ×2 IMPLANT
SEALANT SURGICAL APPL DUAL CAN (MISCELLANEOUS) IMPLANT
SHEARS HARMONIC ACE PLUS 45CM (MISCELLANEOUS) ×2 IMPLANT
SLEEVE ADV FIXATION 5X100MM (TROCAR) ×4 IMPLANT
SLEEVE GASTRECTOMY 40FR VISIGI (MISCELLANEOUS) ×2 IMPLANT
SOLUTION ANTI FOG 6CC (MISCELLANEOUS) ×2 IMPLANT
SPONGE LAP 18X18 X RAY DECT (DISPOSABLE) ×2 IMPLANT
STAPLER ECHELON BIOABSB 60 FLE (MISCELLANEOUS) ×10 IMPLANT
STAPLER ECHELON LONG 60 440 (INSTRUMENTS) ×2 IMPLANT
STAPLER RELOAD BLUE 60MM (STAPLE) ×4
STAPLER RELOAD GOLD 60MM (STAPLE) ×2
STAPLER RELOAD GREEN 60MM (STAPLE) ×4
SUT MNCRL AB 4-0 PS2 18 (SUTURE) ×4 IMPLANT
SUT SURGIDAC NAB ES-9 0 48 120 (SUTURE) IMPLANT
SUT VICRYL 0 TIES 12 18 (SUTURE) ×2 IMPLANT
SYR 10ML ECCENTRIC (SYRINGE) ×2 IMPLANT
SYR 20CC LL (SYRINGE) ×2 IMPLANT
SYR 50ML LL SCALE MARK (SYRINGE) ×2 IMPLANT
TOWEL OR 17X26 10 PK STRL BLUE (TOWEL DISPOSABLE) ×2 IMPLANT
TOWEL OR NON WOVEN STRL DISP B (DISPOSABLE) ×2 IMPLANT
TRAY FOLEY W/METER SILVER 16FR (SET/KITS/TRAYS/PACK) IMPLANT
TROCAR ADV FIXATION 5X100MM (TROCAR) ×2 IMPLANT
TROCAR BLADELESS 15MM (ENDOMECHANICALS) ×2 IMPLANT
TROCAR BLADELESS OPT 5 100 (ENDOMECHANICALS) ×2 IMPLANT
TUBING CONNECTING 10 (TUBING) ×2 IMPLANT
TUBING ENDO SMARTCAP (MISCELLANEOUS) ×2 IMPLANT
TUBING INSUF HEATED (TUBING) ×2 IMPLANT

## 2016-05-21 NOTE — Op Note (Signed)
Preoperative diagnosis: laparoscopic sleeve gastrectomy  Postoperative diagnosis: Same   Procedure: Upper endoscopy   Surgeon: Chelsea A Connor, M.D.  Anesthesia: Gen.   Indications for procedure: This patient was undergoing a laparoscopic sleeve gastrectomy.   Description of procedure: The endoscopy was placed in the mouth and into the oropharynx and under endoscopic vision it was advanced to the esophagogastric junction. The pouch was insufflated and no bleeding or bubbles were seen. The GEJ was identified at 40cm from the teeth.  No bleeding or leaks were detected. The scope was withdrawn without difficulty.   Chelsea A Connor, M.D. General, Bariatric, & Minimally Invasive Surgery [facility]    

## 2016-05-21 NOTE — H&P (Signed)
Tracie Johnson 05/09/2016 11:15 AM Location: [facility] Patient #: [id number] DOB: [birth year] Married / Language: English / Race: White Female   History of Present Illness Randall Hiss M. Dior Dominik MD; 05/10/2016 7:18 PM) The patient is a 35 year old female who presents for a preoperative evaluation. She comes in today for her preoperative evaluation. She has been approved for laparoscopic sleeve gastrectomy. I initially met her in March. Her weight that time was 238 pounds with a BMI of 41. She underwent 6 months of supervised weight loss. She denies any significant medical changes since she was initially seen other than stopping smoking 1 month ago. She says that it was difficult but she was able to do it. She denies any chest pain, chest pressure, shortness of breath, dyspnea on exertion, abdominal pain. She does have reflux but does not take any medications for it. She still has some occasional ankle edema.  Her upper GI was within normal limits. No evidence of esophageal dysmotility or hiatal hernia. Chest x-ray was also within normal limits. Bariatric initial evaluation labs done a few days ago showed a normal CBC, comprehensive metabolic panel is within normal limits. Lipid panel was also normal. TSH was normal at 2.1. A1c was 4.8. Pregnancy test negative.   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  Diagnostic Studies History Gayland Curry, MD; 05/10/2016 7:20 PM) Colonoscopy 1-5 years ago Mammogram never Pap Smear 1-5 years ago  Allergies Marjean Donna, [place]; 05/09/2016 11:02 AM) No Known Drug Allergies03/30/2017  Medication History Gayland Curry, MD; 05/10/2016 7:20 PM) Hydrocodone-Acetaminophen (7.5-325MG Tablet, Oral as needed) Active. Gabapentin (300MG Capsule, Oral) Active. FLUoxetine HCl (20MG Capsule, Oral) Active. Narcan (4MG/0.1ML Liquid, Nasal) Active. Medications Reconciled Pantoprazole Sodium (40MG Tablet DR, 1 Tablet Oral daily, Taken starting 05/09/2016) Active. Zofran ODT (4MG Tablet Disint, 1 (one) Tablet Disperse Oral every six hours, as needed, Taken starting 05/09/2016) Active. OxyCODONE HCl (5MG/5ML Solution, 5-10 Milliliter Oral every four hours, as needed, Taken starting 05/09/2016) Active.  Pregnancy / Birth History Gayland Curry, MD; 05/10/2016 7:20 PM) Irregular periods Maternal age 78-20 Para 28 Age of menopause <43 Contraceptive History Oral contraceptives. Gravida 2 Age at menarche 56 years.    Review of Systems Randall Hiss M. Joyclyn Plazola MD; 05/10/2016 7:15 PM) General Present- Fatigue, Night Sweats and Weight Gain. Not Present- Appetite Loss, Chills, Fever and Weight Loss. Skin Present- Dryness and Non-Healing Wounds. Not Present- Change in Wart/Mole, Hives, Jaundice, New Lesions, Rash and Ulcer. HEENT Not Present- Earache, Hearing Loss, Hoarseness, Nose Bleed, Oral Ulcers, Ringing in the Ears, Seasonal Allergies, Sinus Pain, Sore Throat, Visual Disturbances, Wears glasses/contact lenses and Yellow Eyes. Respiratory Not Present- Bloody sputum, Chronic Cough, Difficulty Breathing, Snoring and Wheezing. Breast Present- Breast Pain. Not Present- Breast Mass, Nipple Discharge and Skin Changes. Cardiovascular Present- Swelling of Extremities. Not Present- Chest Pain,  Difficulty Breathing Lying Down, Leg Cramps, Palpitations, Rapid Heart Rate and Shortness of Breath. Gastrointestinal Present- Change in Bowel Habits, Excessive gas, Hemorrhoids, Indigestion and Nausea. Not Present-  Abdominal Pain, Bloating, Bloody Stool, Chronic diarrhea, Constipation, Difficulty Swallowing, Gets full quickly at meals, Rectal Pain and Vomiting. Female Genitourinary Present- Frequency, Nocturia, Pelvic Pain and Urgency. Not Present- Painful Urination. Musculoskeletal Present- Back Pain, Joint Pain, Joint Stiffness and Swelling of Extremities. Not Present- Muscle Pain and Muscle Weakness. Neurological Present- Decreased Memory, Numbness, Tingling, Tremor and Weakness. Not Present- Fainting, Headaches, Seizures and Trouble walking. Psychiatric Present- Anxiety, Bipolar, Change in Sleep Pattern and Depression. Not Present- Fearful and Frequent crying. Endocrine Present- Excessive Hunger, Heat Intolerance and Hot flashes. Not Present- Cold Intolerance, Hair Changes and New Diabetes. Hematology Not Present- Easy Bruising, Excessive bleeding, Gland problems, HIV and Persistent Infections.  Vitals (Sonya Bynum CMA; 05/09/2016 11:02 AM) 05/09/2016 11:02 AM Weight: 242 lb Height: 62in Body Surface Area: 2.07 m Body Mass Index: 44.26 kg/m  Temp.: 91F(Temporal)  Pulse: 82 (Regular)  BP: 126/80 (Sitting, Left Arm, Standard)       Physical Exam Randall Hiss M. Rajanee Schuelke MD; 05/10/2016 7:15 PM) General Mental Status-Alert. General Appearance-Consistent with stated age. Hydration-Well hydrated. Voice-Normal. Note: morbidly obese   Head and Neck Head-normocephalic, atraumatic with no lesions or palpable masses. Trachea-midline. Thyroid Gland Characteristics - normal size and consistency.  Eye Eyeball - Bilateral-Extraocular movements intact. Sclera/Conjunctiva - Bilateral-No scleral icterus.  Chest and Lung Exam Chest and lung exam reveals -quiet, even  and easy respiratory effort with no use of accessory muscles and on auscultation, normal breath sounds, no adventitious sounds and normal vocal resonance. Inspection Chest Wall - Normal. Back - normal.  Breast - Did not examine.  Cardiovascular Cardiovascular examination reveals -normal heart sounds, regular rate and rhythm with no murmurs and normal pedal pulses bilaterally.  Abdomen Inspection Inspection of the abdomen reveals - No Hernias. Skin - Scar - Note: tummy tuck scars. Palpation/Percussion Palpation and Percussion of the abdomen reveal - Soft, Non Tender, No Rebound tenderness, No Rigidity (guarding) and No hepatosplenomegaly. Auscultation Auscultation of the abdomen reveals - Bowel sounds normal.  Peripheral Vascular Upper Extremity Palpation - Pulses bilaterally normal.  Neurologic Neurologic evaluation reveals -alert and oriented x 3 with no impairment of recent or remote memory. Mental Status-Normal.  Neuropsychiatric The patient's mood and affect are described as -normal. Judgment and Insight-insight is appropriate concerning matters relevant to self.  Musculoskeletal Normal Exam - Left-Upper Extremity Strength Normal and Lower Extremity Strength Normal. Normal Exam - Right-Upper Extremity Strength Normal and Lower Extremity Strength Normal.  Lymphatic Head & Neck  General Head & Neck Lymphatics: Bilateral - Description - Normal. Axillary - Did not examine. Femoral & Inguinal - Did not examine.    Assessment & Plan Randall Hiss M. Folasade Mooty MD; 05/10/2016 7:20 PM) MORBID OBESITY WITH BMI OF 40.0-44.9, ADULT (E66.01) Impression: We reviewed her preoperative workup. We'll discuss results of her labs, chest x-ray, upper GI. I congratulated her on stopping smoking. Her urine nicotine test is still pending. We did discuss that if her urine was positive for significant levels of nicotine that her surgery would be pushed further out. We discussed the typical  hospital and postoperative course. She was given her postoperative pain, heartburn, and nausea prescriptions today. We discussed the importance of the preoperative diet to help shrink the liver. All of her questions were asked and answered. Current Plans Pt Education - EMW_preopbariatric POLYCYSTIC OVARIAN SYNDROME (E28.2) HISTORY OF TOBACCO USE (F64.332) Impression: Urine nicotine test pending. If positive surgery will be pushed out further URINARY, INCONTINENCE, STRESS FEMALE (N39.3) LUMBOSACRAL PAIN (M54.5) GASTROESOPHAGEAL REFLUX DISEASE WITHOUT ESOPHAGITIS (K21.9) Current Plans  Started Pantoprazole Sodium 40MG, 1 Tablet daily, #30, 30 days starting 05/09/2016, Ref. x3. Started Zofran ODT 4MG, 1 (one) Tablet Disperse every six hours, as needed, #20, 05/09/2016, No Refill. Started OxyCODONE HCl 5MG/5ML, 5-10 Milliliter every four hours, as needed, 200 Milliliter, 05/09/2016, No Refill.  Leighton Ruff. Redmond Pulling, MD, FACS General, Bariatric, & Minimally Invasive Surgery [facility], [place]

## 2016-05-21 NOTE — Anesthesia Postprocedure Evaluation (Signed)
Anesthesia Post Note  Patient: Tracie Johnson  Procedure(s) Performed: Procedure(s) (LRB): LAPAROSCOPIC GASTRIC SLEEVE RESECTION, UPPER ENDOSCOPY (N/A)  Patient location during evaluation: PACU Anesthesia Type: General Level of consciousness: awake and alert Pain management: pain level controlled Vital Signs Assessment: post-procedure vital signs reviewed and stable Respiratory status: spontaneous breathing, nonlabored ventilation, respiratory function stable and patient connected to nasal cannula oxygen Cardiovascular status: blood pressure returned to baseline and stable Postop Assessment: no signs of nausea or vomiting Anesthetic complications: no    Last Vitals:  Vitals:   05/21/16 1345 05/21/16 1401  BP: 136/78 118/75  Pulse: 71 81  Resp: 14 14  Temp:  36.7 C    Last Pain:  Vitals:   05/21/16 1345  TempSrc:   PainSc: Asleep                 Ernestine Rohman J

## 2016-05-21 NOTE — Discharge Instructions (Addendum)
[facility]prepitant Discharge Instructions  °On the day of surgery you were given the medication aprepitant. This medication interacts with hormonal forms of birth control (oral contraceptives and injected or implanted birth control) and may make them ineffective. °IF YOU USE ANY HORMONAL FORM OF BIRTH CONTROL, YOU MUST USE AN ADDITIONAL BARRIER BIRTH CONTROL METHOD FOR ONE MONTH after receiving aprepitant or there is a chance you could become pregnant. ° ° ° ° °GASTRIC BYPASS/SLEEVE ° Home Care Instructions ° ° These instructions are to help you care for yourself when you go home. ° °Call: If you have any problems. °• Call [phone] and ask for the surgeon on call °• If you need immediate assistance come to the ER at [facility]. Tell the ER staff you are a new post-op gastric bypass or gastric sleeve patient  °Signs and symptoms to report: • Severe  vomiting or nausea °o If you cannot handle clear liquids for longer than 1 day, call your surgeon °• Abdominal pain which does not get better after taking your pain medication °• Fever greater than 100.4°  F and chills °• Heart rate over 100 beats a minute °• Trouble breathing °• Chest pain °• Redness,  swelling, drainage, or foul odor at incision (surgical) sites °• If your incisions open or pull apart °• Swelling or pain in calf (lower leg) °• Diarrhea (Loose bowel movements that happen often), frequent watery, uncontrolled bowel movements °• Constipation, (no bowel movements for 3 days) if this happens: °o Take Milk of Magnesia, 2 tablespoons by mouth, 3 times a day for 2 days if needed °o Stop taking Milk of Magnesia once you have had a bowel movement °o Call your doctor if constipation continues °Or °o Take Miralax  (instead of Milk of Magnesia) following the label instructions °o Stop taking Miralax once you have had a bowel movement °o Call your doctor if constipation continues °• Anything you think is “abnormal for you” °  °Normal side effects after surgery: • Unable  to sleep at night or unable to concentrate °• Irritability °• Being tearful (crying) or depressed ° °These are common complaints, possibly related to your anesthesia, stress of surgery, and change in lifestyle, that usually go away a few weeks after surgery. If these feelings continue, call your medical doctor.  °Wound Care: You may have surgical glue, steri-strips, or staples over your incisions after surgery °• Surgical glue: Looks like clear film over your incisions and will wear off a little at a time °• Steri-strips: Adhesive strips of tape over your incisions. You may notice a yellowish color on skin under the steri-strips. This is used to make the steri-strips stick better. Do not pull the steri-strips off - let them fall off °• Staples: Staples may be removed before you leave the hospital °o If you go home with staples, call [facility] for an appointment with your surgeon’s nurse to have staples removed 10 days after surgery, [phone] °• Showering: You may shower two (2) days after your surgery unless your surgeon tells you differently °o Wash gently around incisions with warm soapy water, rinse well, and gently pat dry °o If you have a drain (tube from your incision), you may need someone to hold this while you shower °o No tub baths until staples are removed and incisions are healed °  °Medications: • Medications should be liquid or crushed if larger than the size of a dime °• Extended release pills (medication that releases a little bit at   a time through the  day) should not be crushed °• Depending on the size and number of medications you take, you may need to space (take a few throughout the day)/change the time you take your medications so that you do not over-fill your pouch (smaller stomach) °• Make sure you follow-up with you primary care physician to make medication changes needed during rapid weight loss and life -style changes °• If you have diabetes, follow up with your  doctor that orders your diabetes medication(s) within one week after surgery and check your blood sugar regularly ° °• Do not drive while taking narcotics (pain medications) ° °• Do not take acetaminophen (Tylenol) and Roxicet or Lortab Elixir at the same time since these pain medications contain acetaminophen °  °Diet:  °First 2 Weeks You will see the nutritionist about two (2) weeks after your surgery. The nutritionist will increase the types of foods you can eat if you are handling liquids well: °• If you have severe vomiting or nausea and cannot handle clear liquids lasting longer than 1 day call your surgeon °Protein Shake °• Drink at least 2 ounces of shake 5-6 times per day °• Each serving of protein shakes (usually 8-12 ounces) should have a minimum of: °o 15 grams of protein °o And no more than 5 grams of carbohydrate °• Goal for protein each day: °o Men = 80 grams per day °o Women = 60 grams per day °  ° • Protein powder may be added to fluids such as non-fat milk or Lactaid milk or Soy milk (limit to 35 grams added protein powder per serving) ° °Hydration °• Slowly increase the amount of water and other clear liquids as tolerated (See Acceptable Fluids) °• Slowly increase the amount of protein shake as tolerated °• Sip fluids slowly and throughout the day °• May use sugar substitutes in small amounts (no more than 6-8 packets per day; i.e. Splenda) ° °Fluid Goal °• The first goal is to drink at least 8 ounces of protein shake/drink per day (or as directed by the nutritionist); some examples of protein shakes are Syntrax Nectar, Adkins Advantage, EAS Edge HP, and Unjury. - See handout from pre-op Bariatric Education Class: °o Slowly increase the amount of protein shake you drink as tolerated °o You may find it easier to slowly sip shakes throughout the day °o It is important to get your proteins in first °• Your fluid goal is to drink 64-100 ounces of fluid daily °o It may take a few weeks to build up to  this  °• 32 oz. (or more) should be clear liquids °And °• 32 oz. (or more) should be full liquids (see below for examples) °• Liquids should not contain sugar, caffeine, or carbonation ° °Clear Liquids: °• Water of Sugar-free flavored water (i.e. Fruit H²O, Propel) °• Decaffeinated coffee or tea (sugar-free) °• Crystal lite, Wyler’s Lite, Minute Maid Lite °• Sugar-free Jell-O °• Bouillon or broth °• Sugar-free Popsicle:    - Less than 20 calories each; Limit 1 per day ° °Full Liquids: °                  Protein Shakes/Drinks + 2 choices per day of other full liquids °• Full liquids must be: °o No More Than 12 grams of Carbs per serving °o No More Than 3 grams of Fat per serving °• Strained low-fat cream soup °• Non-Fat milk °• Fat-free Lactaid Milk °• Sugar-free yogurt (Dannon Lite & Fit, Greek   yogurt) ° °  °Vitamins and Minerals • Start 1 day after surgery unless otherwise directed by your surgeon °• 2 Chewable Multivitamin / Multimineral Supplement with iron (i.e. Centrum for Adults) °• Vitamin B-12, 350-500 micrograms sub-lingual (place tablet under the tongue) each day °• Chewable Calcium Citrate with Vitamin D-3 °(Example: 3 Chewable Calcium  Plus 600 with Vitamin D-3) °o Take 500 mg three (3) times a day for a total of 1500 mg each day °o Do not take all 3 doses of calcium at one time as it may cause constipation, and you can only absorb 500 mg at a time °o Do not mix multivitamins containing iron with calcium supplements;  take 2 hours apart °o Do not substitute Tums (calcium carbonate) for your calcium °• Menstruating women and those at risk for anemia ( a blood disease that causes weakness) may need extra iron °o Talk to your doctor to see if you need more iron °• If you need extra iron: Total daily Iron recommendation (including Vitamins) is 50 to 100 mg Iron/day °• Do not stop taking or change any vitamins or minerals until you talk to your nutritionist or surgeon °• Your nutritionist and/or surgeon must  approve all vitamin and mineral supplements °  °Activity and Exercise: It is important to continue walking at home. Limit your physical activity as instructed by your doctor. During this time, use these guidelines: °• Do not lift anything greater than ten  (10) pounds for at least two (2) weeks °• Do not go back to work or drive until your surgeon says you can °• You may have sex when you feel comfortable °o It is VERY important for female patients to use a reliable birth control method; fertility often increase after surgery °o Do not get pregnant for at least 18 months °• Start exercising as soon as your doctor tells you that you can °o Make sure your doctor approves any physical activity °• Start with a simple walking program °• Walk 5-15 minutes each day, 7 days per week °• Slowly increase until you are walking 30-45 minutes per day °• Consider joining our BELT program. [phone] or email [email or web address] °  °Special Instructions Things to remember: °• Free counseling is available for you and your family through collaboration between [facility] and [employer]. Please call [phone] and leave a message °• Use your CPAP when sleeping if this applies to you °• Consider buying a medical alert bracelet that says you had lap-band surgery °  °  You will likely have your first fill (fluid added to your band) 6 - 8 weeks after surgery °• [facility] has a free Bariatric Surgery Support Group that meets monthly, the 3rd Thursday, 6pm. [facility] Classrooms. You can see classes online at [email or web address] °• It is very important to keep all follow up appointments with your surgeon, nutritionist, primary care physician, and behavioral health practitioner °o After the first year, please follow up with your bariatric surgeon and nutritionist at least once a year in order to maintain best weight loss results °      °             [facility]:  [phone] ° °             [facility]: [phone] ° °             Bariatric Nurse Coordinator: [phone]  °Gastric Bypass/Sleeve Home Care   Instructions  Rev. 09/2012    ° °                                                    Reviewed and Endorsed °                                                   by [place] Patient Education Committee, Jan, 2014 ° ° ° ° ° ° ° ° ° °

## 2016-05-21 NOTE — Op Note (Signed)
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) (HCC)   Lumbosacral pain, chronic   History of prediabetes   GERD (gastroesophageal reflux disease)   PCOS (polycystic ovarian syndrome)   Overflow stress urinary incontinence in female   POST-OPERATIVE DIAGNOSIS:  same  PROCEDURE:  Procedure(s): LAPAROSCOPIC SLEEVE GASTRECTOMY  UPPER GI ENDOSCOPY  SURGEON:  Surgeon(s): Gayland Curry, MD FACS FASMBS  ASSISTANTS: Romana Juniper MD  ANESTHESIA:   general  DRAINS: none   BOUGIE: 40 fr ViSiGi  LOCAL MEDICATIONS USED:  MARCAINE + Exparel  SPECIMEN:  Source of Specimen:  Greater curvature of stomach  DISPOSITION OF SPECIMEN:  PATHOLOGY  COUNTS:  YES  INDICATION FOR PROCEDURE: This is a very pleasant 35 y.o.-year-old morbidly obese female who has had unsuccessful attempts for sustained weight loss. The patient presents today for a planned laparoscopic sleeve gastrectomy with upper endoscopy. We have discussed the risk and benefits of the procedure extensively preoperatively. Please see my separate notes.  PROCEDURE: After obtaining informed consent and receiving 5000 units of subcutaneous heparin, the patient was brought to the operating room at [facility] and placed supine on the operating room table. General endotracheal anesthesia was established. Sequential compression devices were placed. A orogastric tube was placed. The patient's abdomen was prepped and draped in the usual standard surgical fashion. The patient received preoperative IV antibiotic. A surgical timeout was performed.  Access to the abdomen was achieved using a 5 mm 0 laparoscope thru a 5 mm trocar In the left upper Quadrant 2 fingerbreadths below the left subcostal margin using the Optiview technique. Pneumoperitoneum was smoothly established up to 15 mm of mercury. The laparoscope was advanced and the abdominal cavity was surveilled.  The patient was then placed in reverse Trendelenburg. There was no evidence of a hiatal hernia on laparoscopy - gap in the left and right crus anteriorly.  A 5 mm trocar was placed slightly above and to the left of the umbilicus under direct visualization.  The [facility] liver retractor was placed under the left lobe of the liver through a 5 mm trocar incision site in the subxiphoid position. A 5 mm trocar was placed in the lateral right upper quadrant along with a 15 mm trocar in the mid right abdomen. A final 5 mm trocar was placed in the lateral LUQ.  All under direct visualization after local had been infiltrated.  The stomach was inspected. It was completely decompressed and the orogastric tube was removed.  There was no anterior dimple that was obviously visible. Her preop UGI did not show a hiatal hernia. The calibration tube was placed in the oropharynx and guided down into the stomach by the CRNA. 10 mL of air was insufflated into the calibration balloon. The calibration tubing was then gently pulled back by the CRNA and it did not slide past the GE junction. At this point the calibration tubing was desufflated and removed from the patient's body.   We identified the pylorus and measured 6 cm proximal to the pylorus and identified an area of where we would start taking down the short gastric vessels. Harmonic scalpel was used to take down the short gastric vessels along the greater curvature of the stomach. We were able to enter the lesser sac. We continued to march along the greater curvature of the stomach taking down the short gastrics. As we approached the gastrosplenic ligament we took care in this area not  to injure the spleen. We were able to take down the entire gastrosplenic ligament. We then mobilized the fundus away from the left crus of diaphragm. There were not any significant posterior gastric avascular attachments. This left the stomach completely mobilized. No vessels had been taken  down along the lesser curvature of the stomach.  We then reidentified the pylorus. A 40Fr ViSiGi was then placed in the oropharynx and advanced down into the stomach and placed in the distal antrum and positioned along the lesser curvature. It was placed under suction which secured the 40Fr ViSiGi in place along the lesser curve. Then using the Ethicon echelon 60 mm stapler with a green load with Seamguard, I placed a stapler along the antrum approximately 5 cm from the pylorus. The stapler was angled so that there is ample room at the angularis incisura. I then fired the first staple load after inspecting it posteriorly to ensure adequate space both anteriorly and posteriorly. At this point I still was not completely past the angularis so with another green load with Seamguard, I placed the stapler in position just inside the prior stapleline. We then rotated the stomach to insure that there was adequate anteriorly as well as posteriorly. The stapler was then fired. At this point I started using 60 mm gold load staple cartridges x1 with Seamguard. The echelon stapler was then repositioned with a 60 mm gold load with Seamguard and we continued to march up along the [place]. My assistant was holding traction along the greater curvature stomach along the cauterized short gastric vessels ensuring that the stomach was symmetrically retracted. Prior to each firing of the staple, we rotated the stomach to ensure that there is adequate stomach left.  As we approached the fundus, I used 60 mm blue cartridge with Seamguard aiming slightly lateral to the esophageal fat pad. The sleeve was inspected. There is no evidence of cork screw. The staple line appeared hemostatic. The CRNA inflated the ViSiGi to the green zone and the upper abdomen was flooded with saline. There were no bubbles. The sleeve was decompressed and the ViSiGi removed. My assistant scrubbed out and performed an upper endoscopy. The sleeve easily distended  with air and the scope was easily advanced to the pylorus. There is no evidence of internal bleeding or cork screwing. There was no narrowing at the angularis. There is no evidence of bubbles. Please see her operative note for further details. The gastric sleeve was decompressed and the endoscope was removed.  The greater curvature the stomach was grasped with a laparoscopic grasper and removed from the 15 mm trocar site.  The liver retractor was removed. I then closed the 15 mm trocar site with 1 interrupted 0 Vicryl sutures through the fascia using the endoclose. The closure was viewed laparoscopically and it was airtight. 70 cc of Exparel was then infiltrated in the preperitoneal spaces around the trocar sites. Pneumoperitoneum was released. All trocar sites were closed with a 4-0 Monocryl in a subcuticular fashion followed by the application of Dermabond. The patient was extubated and taken to the recovery room in stable condition. All needle, instrument, and sponge counts were correct x2. There are no immediate complications  (2) 60 mm green with Seamguard (1) 60 mm gold with seamguard (1) 60 mm blue with seamguard  PLAN OF CARE: Admit to inpatient   PATIENT DISPOSITION:  PACU - hemodynamically stable.   Delay start of Pharmacological VTE agent (>24hrs) due to surgical blood loss or risk of bleeding:  no  Leighton Ruff. Redmond Pulling, MD, FACS FASMBS General, Bariatric, & Minimally Invasive Surgery [facility], [place]

## 2016-05-21 NOTE — Transfer of Care (Signed)
Immediate Anesthesia Transfer of Care Note  Patient: Tracie Johnson  Procedure(s) Performed: Procedure(s): LAPAROSCOPIC GASTRIC SLEEVE RESECTION, UPPER ENDOSCOPY (N/A)  Patient Location: PACU  Anesthesia Type:General  Level of Consciousness: awake, alert  and oriented  Airway & Oxygen Therapy: Patient Spontanous Breathing and Patient connected to face mask oxygen  Post-op Assessment: Report given to RN and Post -op Vital signs reviewed and stable  Post vital signs: Reviewed and stable  Last Vitals:  Vitals:   05/21/16 0748  BP: 125/82  Pulse: 97  Resp: 18  Temp: 36.7 C    Last Pain:  Vitals:   05/21/16 0748  TempSrc: Oral      Patients Stated Pain Goal: 4 ([id number])  Complications: No apparent anesthesia complications

## 2016-05-21 NOTE — Interval H&P Note (Signed)
History and Physical Interval Note:  05/21/2016 10:33 AM  Tracie Johnson  has presented today for surgery, with the diagnosis of Morbid Obesity  The various methods of treatment have been discussed with the patient and family. After consideration of risks, benefits and other options for treatment, the patient has consented to  Procedure(s): LAPAROSCOPIC GASTRIC SLEEVE RESECTION, UPPER ENDO (N/A) as a surgical intervention .  The patient's history has been reviewed, patient examined, no change in status, stable for surgery.  I have reviewed the patient's chart and labs.  Questions were answered to the patient's satisfaction.    Urine nicotine test was negative  Leighton Ruff. Redmond Pulling, MD, [place], Bariatric, & Minimally Invasive Surgery [facility], [place]   [facility]

## 2016-05-21 NOTE — Anesthesia Preprocedure Evaluation (Signed)
Anesthesia Evaluation  Patient identified by MRN, date of birth, ID band Patient awake    Reviewed: Allergy & Precautions, NPO status , Patient's Chart, lab work & pertinent test results  History of Anesthesia Complications (+) PONV and history of anesthetic complications  Airway Mallampati: II  TM Distance: >3 FB Neck ROM: Full    Dental no notable dental hx.    Pulmonary asthma , former smoker,    Pulmonary exam normal breath sounds clear to auscultation       Cardiovascular negative cardio ROS Normal cardiovascular exam Rhythm:Regular Rate:Normal     Neuro/Psych negative neurological ROS  negative psych ROS   GI/Hepatic Neg liver ROS, GERD  ,  Endo/Other  Morbid obesity  Renal/GU negative Renal ROS  negative genitourinary   Musculoskeletal negative musculoskeletal ROS (+)   Abdominal (+) + obese,   Peds negative pediatric ROS (+)  Hematology negative hematology ROS (+)   Anesthesia Other Findings   Reproductive/Obstetrics negative OB ROS                             Anesthesia Physical Anesthesia Plan  ASA: III  Anesthesia Plan: General   Post-op Pain Management:    Induction: Intravenous  Airway Management Planned: Oral ETT  Additional Equipment:   Intra-op Plan:   Post-operative Plan: Extubation in OR  Informed Consent: I have reviewed the patients History and Physical, chart, labs and discussed the procedure including the risks, benefits and alternatives for the proposed anesthesia with the patient or authorized representative who has indicated his/her understanding and acceptance.   Dental advisory given  Plan Discussed with: CRNA  Anesthesia Plan Comments:         Anesthesia Quick Evaluation

## 2016-05-21 NOTE — Anesthesia Procedure Notes (Signed)
Procedure Name: Intubation Date/Time: 05/21/2016 11:08 AM Performed by: Noralyn Pick D Pre-anesthesia Checklist: Patient identified, Emergency Drugs available, Suction available and Patient being monitored Patient Re-evaluated:Patient Re-evaluated prior to inductionOxygen Delivery Method: Circle system utilized Preoxygenation: Pre-oxygenation with 100% oxygen Intubation Type: IV induction Ventilation: Mask ventilation without difficulty Laryngoscope Size: Mac and 4 Grade View: Grade I Tube type: Oral Tube size: 7.5 mm Number of attempts: 1 Airway Equipment and Method: Stylet Placement Confirmation: ETT inserted through vocal cords under direct vision,  positive ETCO2 and breath sounds checked- equal and bilateral Secured at: 21 cm Tube secured with: Tape Dental Injury: Teeth and Oropharynx as per pre-operative assessment

## 2016-05-22 LAB — COMPREHENSIVE METABOLIC PANEL
ALBUMIN: 3.5 g/dL (ref 3.5–5.0)
ALT: 40 U/L (ref 14–54)
ANION GAP: 5 (ref 5–15)
AST: 41 U/L (ref 15–41)
Alkaline Phosphatase: 68 U/L (ref 38–126)
BILIRUBIN TOTAL: 0.7 mg/dL (ref 0.3–1.2)
BUN: 8 mg/dL (ref 6–20)
CHLORIDE: 102 mmol/L (ref 101–111)
CO2: 28 mmol/L (ref 22–32)
Calcium: 8.8 mg/dL — ABNORMAL LOW (ref 8.9–10.3)
Creatinine, Ser: 0.64 mg/dL (ref 0.44–1.00)
GFR calc Af Amer: >60 mL/min (ref >60)
GLUCOSE: 155 mg/dL — AB (ref 65–99)
POTASSIUM: 5.4 mmol/L — AB (ref 3.5–5.1)
Sodium: 135 mmol/L (ref 135–145)
TOTAL PROTEIN: 6.9 g/dL (ref 6.5–8.1)

## 2016-05-22 LAB — CBC WITH DIFFERENTIAL/PLATELET
BASOS ABS: 0 10*3/uL (ref 0.0–0.1)
BASOS PCT: 0 %
EOS ABS: 0 10*3/uL (ref 0.0–0.7)
EOS PCT: 0 %
HEMATOCRIT: 34.7 % — AB (ref 36.0–46.0)
Hemoglobin: 11.8 g/dL — ABNORMAL LOW (ref 12.0–15.0)
Lymphocytes Relative: 6 %
Lymphs Abs: 0.5 10*3/uL — ABNORMAL LOW (ref 0.7–4.0)
MCH: 29 pg (ref 26.0–34.0)
MCHC: 34 g/dL (ref 30.0–36.0)
MCV: 85.3 fL (ref 78.0–100.0)
MONO ABS: 0.2 10*3/uL (ref 0.1–1.0)
MONOS PCT: 3 %
NEUTROS ABS: 7.3 10*3/uL (ref 1.7–7.7)
Neutrophils Relative %: 91 %
PLATELETS: 219 10*3/uL (ref 150–400)
RBC: 4.07 MIL/uL (ref 3.87–5.11)
RDW: 13.2 % (ref 11.5–15.5)
WBC: 7.9 10*3/uL (ref 4.0–10.5)

## 2016-05-22 LAB — NICOTINE AND METABS, URINE
COTININE, URINE: NOT DETECTED ng/mL
NICOTINE, URINE: NOT DETECTED ng/mL

## 2016-05-22 LAB — HEMOGLOBIN AND HEMATOCRIT, BLOOD
HCT: 35.3 % — ABNORMAL LOW (ref 36.0–46.0)
HEMOGLOBIN: 11.8 g/dL — AB (ref 12.0–15.0)

## 2016-05-22 MED ORDER — OXYCODONE HCL 5 MG/5ML PO SOLN: 5.0000 mg | ORAL | 0 refills | Status: DC | PRN

## 2016-05-22 NOTE — Progress Notes (Signed)
Patient alert and oriented, pain is controlled. Patient is tolerating fluids, advanced to protein shake today, patient is tolerating well.  Reviewed Gastric sleeve discharge instructions with patient and patient is able to articulate understanding.  Provided information on BELT program, Support Group and [facility] outpatient pharmacy. All questions answered, will continue to monitor.  

## 2016-05-22 NOTE — Progress Notes (Signed)
Discharge instructions discussed with patient and family, verbalized agreement and understanding 

## 2016-05-22 NOTE — Plan of Care (Signed)
Problem: Food- and Nutrition-Related Knowledge Deficit (NB-1.1) Goal: Nutrition education Formal process to instruct or train a patient/client in a skill or to impart knowledge to help patients/clients voluntarily manage or modify food choices and eating behavior to maintain or improve health. Outcome: Completed/Met Date Met: 05/22/16 Nutrition Education Note  Received consult for diet education per DROP protocol.   Discussed 2 week post op diet with pt. Emphasized that liquids must be non carbonated, non caffeinated, and sugar free. Fluid goals discussed. Reviewed progression of diet to include soft proteins at 7-10 days post-op. Pt to follow up with outpatient bariatric RD for further diet progression after 2 weeks. Multivitamins and minerals also reviewed. Teach back method used, pt expressed understanding, expect good compliance.   Diet: First 2 Weeks  You will see the dietitian about two (2) weeks after your surgery. The dietitian will increase the types of foods you can eat if you are handling liquids well:  If you have severe vomiting or nausea and cannot handle clear liquids lasting longer than 1 day, call your surgeon  Protein Shake  Drink at least 2 ounces of shake 5-6 times per day  Each serving of protein shakes (usually 8 - 12 ounces) should have a minimum of:  15 grams of protein  And no more than 5 grams of carbohydrate  Goal for protein each day:  Men = 80 grams per day  Women = 60 grams per day  Protein powder may be added to fluids such as non-fat milk or Lactaid milk or Soy milk (limit to 35 grams added protein powder per serving)   Hydration  Slowly increase the amount of water and other clear liquids as tolerated (See Acceptable Fluids)  Slowly increase the amount of protein shake as tolerated  Sip fluids slowly and throughout the day  May use sugar substitutes in small amounts (no more than 6 - 8 packets per day; i.e. Splenda)   Fluid Goal  The first goal is to  drink at least 8 ounces of protein shake/drink per day (or as directed by the nutritionist); some examples of protein shakes are Syntrax Nectar, Adkins Advantage, EAS Edge HP, and Unjury. See handout from pre-op Bariatric Education Class:  Slowly increase the amount of protein shake you drink as tolerated  You may find it easier to slowly sip shakes throughout the day  It is important to get your proteins in first  Your fluid goal is to drink 64 - 100 ounces of fluid daily  It may take a few weeks to build up to this  32 oz (or more) should be clear liquids  And  32 oz (or more) should be full liquids (see below for examples)  Liquids should not contain sugar, caffeine, or carbonation   Clear Liquids:  Water or Sugar-free flavored water (i.e. Fruit H2O, Propel)  Decaffeinated coffee or tea (sugar-free)  Crystal Lite, Wyler's Lite, Minute Maid Lite  Sugar-free Jell-O  Bouillon or broth  Sugar-free Popsicle: *Less than 20 calories each; Limit 1 per day   Full Liquids:  Protein Shakes/Drinks + 2 choices per day of other full liquids  Full liquids must be:  No More Than 12 grams of Carbs per serving  No More Than 3 grams of Fat per serving  Strained low-fat cream soup  Non-Fat milk  Fat-free Lactaid Milk  Sugar-free yogurt (Dannon Lite & Fit, Greek yogurt)     Lindsey Baker, MS, RD, LDN Pager: [phone] After Hours Pager: [phone]    

## 2016-05-22 NOTE — Progress Notes (Signed)
Patient ID: Tracie Johnson, female   DOB: February 28, 1981, 35 y.o.   MRN: [id number]  Progress Note: Metabolic and Bariatric Surgery Service   Subjective: Had some nausea last night but resolved. Taking in some water this am but has a few cramps with drinking. Has had 55ml water so far  Objective: Vital signs in last 24 hours: Temp:  [98.1 F (36.7 C)-98.9 F (37.2 C)] 98.2 F (36.8 C) (10/03 0557) Pulse Rate:  [51-98] 56 (10/03 0557) Resp:  [11-17] 16 (10/03 0557) BP: (101-154)/(60-85) 122/78 (10/03 0557) SpO2:  [89 %-100 %] 96 % (10/03 0557) Weight:  [109.8 kg (242 lb 1.6 oz)] 109.8 kg (242 lb 1.6 oz) (10/03 0557) Last BM Date: 05/20/16  Intake/Output from previous day: 10/02 0701 - 10/03 0700 In: 2966.7 [I.V.:2616.7; IV Piggyback:350] Out: 1000 [Urine:975; Blood:25] Intake/Output this shift: No intake/output data recorded.  Lungs: cta  Cardiovascular: reg  Abd: soft, expected mild TTP  Extremities: no edema,   Neuro: alert, oriented x3, approp  Lab Results: CBC   Recent Labs  05/21/16 1259 05/22/16 0451  WBC  --  7.9  HGB 12.5 11.8*  HCT 36.5 34.7*  PLT  --  219   BMET  Recent Labs  05/22/16 0451  NA 135  K 5.4*  CL 102  CO2 28  GLUCOSE 155*  BUN 8  CREATININE 0.64  CALCIUM 8.8*   PT/INR No results for input(s): LABPROT, INR in the last 72 hours. ABG No results for input(s): PHART, HCO3 in the last 72 hours.  Invalid input(s): PCO2, PO2  Studies/Results:  Anti-infectives: Anti-infectives    Start     Dose/Rate Route Frequency Ordered Stop   05/21/16 0749  cefoTEtan in Dextrose 5% (CEFOTAN) IVPB 2 g     2 g Intravenous On call to O.R. 05/21/16 0749 05/21/16 1110      Medications: Scheduled Meds: . acetaminophen  1,000 mg Intravenous Q6H  . enoxaparin (LOVENOX) injection  30 mg Subcutaneous Q12H  . famotidine (PEPCID) IV  20 mg Intravenous Q12H  . mouth rinse  15 mL Mouth Rinse BID  . [START ON 05/23/2016] protein supplement shake  2 oz Oral  Q2H   Continuous Infusions: . dextrose 5 % and 0.45 % NaCl with KCl 20 mEq/L 125 mL/hr at 05/22/16 0047   PRN Meds:.oxyCODONE **AND** acetaminophen, acetaminophen (TYLENOL) oral liquid 160 mg/5 mL, diphenhydrAMINE, morphine injection, ondansetron (ZOFRAN) IV, promethazine  Assessment/Plan: Patient Active Problem List   Diagnosis Date Noted  . Obesity, Class III, BMI 40-49.9 (morbid obesity) ([place]) 05/21/2016  . Lumbosacral pain, chronic 05/21/2016  . History of prediabetes 05/21/2016  . GERD (gastroesophageal reflux disease) 05/21/2016  . PCOS (polycystic ovarian syndrome) 05/21/2016  . 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 S/P laparoscopic sleeve gastrectomy 05/21/2016   s/p Procedure(s): LAPAROSCOPIC GASTRIC SLEEVE RESECTION, UPPER ENDOSCOPY 05/21/2016  Looks good. No fever. No tachycardia. Cont POD 1 diet Cont chemical vte prophylaxis Ambulate, pulm toilet Begin discharge teaching  Disposition:  LOS: 1 day  The patient will be in the hospital for normal postop protocol  Gayland Curry, MD [phone] [facility]

## 2016-05-28 NOTE — Discharge Summary (Signed)
Physician Discharge Summary  Tracie Johnson [id number] DOB: Jan 23, 1981 DOA: 05/21/2016  PCP: Evelene Croon, MD  Admit date: 05/21/2016 Discharge date: 05/22/2016  Recommendations for Outpatient Follow-up:    Follow-up Information    Gayland Curry, MD. Go on 06/07/2016.   Specialty:  General Surgery Why:  at 2:30 PM for post-op check Contact information: [address] [place] [address] [phone]        Tracie Pyeatt M, MD .   Specialty:  General Surgery Contact information: [address] [place] [address] [phone]          Discharge Diagnoses:  Principal Problem:   Obesity, Class III, BMI 40-49.9 (morbid obesity) (HCC) Active Problems:   Lumbosacral pain, chronic   History of prediabetes   GERD (gastroesophageal reflux disease)   PCOS (polycystic ovarian syndrome)   Overflow stress urinary incontinence in female   S/P laparoscopic sleeve gastrectomy   Surgical Procedure: Laparoscopic Sleeve Gastrectomy, upper endoscopy  Discharge Condition: Good Disposition: Home  Diet recommendation: Postoperative sleeve gastrectomy diet (liquids only)  Filed Weights   05/21/16 0748 05/22/16 0557  Weight: 107.7 kg (237 lb 6.4 oz) 109.8 kg (242 lb 1.6 oz)     Hospital Course:  The patient was admitted for a planned laparoscopic sleeve gastrectomy. Please see operative note. Preoperatively the patient was given 5000 units of subcutaneous heparin for DVT prophylaxis. Postoperative prophylactic Lovenox dosing was started on the morning of postoperative day 1. On postoperative day 1, she had no fever or tachycardia and she was started on ice chips and water which they tolerated. Later that day the patient's diet was advanced to protein shakes which they also tolerated. The patient was ambulating without difficulty. Their vital signs are stable without fever or tachycardia. Their hemoglobin had remained stable.  The patient had received discharge  instructions and counseling. They were deemed stable for discharge.   Discharge Instructions  Discharge Instructions    Ambulate hourly while awake    Complete by:  As directed    Call MD for:  difficulty breathing, headache or visual disturbances    Complete by:  As directed    Call MD for:  persistant dizziness or light-headedness    Complete by:  As directed    Call MD for:  persistant nausea and vomiting    Complete by:  As directed    Call MD for:  redness, tenderness, or signs of infection (pain, swelling, redness, odor or green/yellow discharge around incision site)    Complete by:  As directed    Call MD for:  severe uncontrolled pain    Complete by:  As directed    Call MD for:  temperature >101 F    Complete by:  As directed    Diet bariatric full liquid    Complete by:  As directed    Discharge instructions    Complete by:  As directed    See bariatric discharge instructions   Incentive spirometry    Complete by:  As directed    Perform hourly while awake       Medication List    STOP taking these medications   HYDROcodone-acetaminophen 7.5-325 MG tablet Commonly known as:  NORCO     TAKE these medications   FLUoxetine 20 MG capsule Commonly known as:  PROZAC Take 80 mg by mouth at bedtime.   gabapentin 300 MG capsule Commonly known as:  NEURONTIN Take 300 mg by mouth 5 (five) times daily.  oxyCODONE 5 MG/5ML solution Commonly known as:  ROXICODONE Take 5-10 mLs (5-10 mg total) by mouth every 4 (four) hours as needed for moderate pain or severe pain.      Follow-up Information    Gayland Curry, MD. Go on 06/07/2016.   Specialty:  General Surgery Why:  at 2:30 PM for post-op check Contact information: [address]  [place] [address] [phone]        Tracie Bowlds M, MD .   Specialty:  General Surgery Contact information: [place] [address] [phone]            The results of  significant diagnostics from this hospitalization (including imaging, microbiology, ancillary and laboratory) are listed below for reference.    Significant Diagnostic Studies: No results found.  Labs: Basic Metabolic Panel:  Recent Labs Lab 05/22/16 0451  NA 135  K 5.4*  CL 102  CO2 28  GLUCOSE 155*  BUN 8  CREATININE 0.64  CALCIUM 8.8*   Liver Function Tests:  Recent Labs Lab 05/22/16 0451  AST 41  ALT 40  ALKPHOS 68  BILITOT 0.7  PROT 6.9  ALBUMIN 3.5    CBC:  Recent Labs Lab 05/22/16 0451 05/22/16 1558  WBC 7.9  --   NEUTROABS 7.3  --   HGB 11.8* 11.8*  HCT 34.7* 35.3*  MCV 85.3  --   PLT 219  --     CBG: No results for input(s): GLUCAP in the last 168 hours.  Principal Problem:   Obesity, Class III, BMI 40-49.9 (morbid obesity) (HCC) Active Problems:   Lumbosacral pain, chronic   History of prediabetes   GERD (gastroesophageal reflux disease)   PCOS (polycystic ovarian syndrome)   Overflow stress urinary incontinence in female   S/P laparoscopic sleeve gastrectomy   Time coordinating discharge: 15 minutes  Signed:  Gayland Curry, MD [facility], [place] [phone] 05/28/2016, 4:37 PM

## 2016-06-05 ENCOUNTER — Encounter: Payer: BLUE CROSS/BLUE SHIELD | Attending: General Surgery | Admitting: Dietician

## 2016-06-05 DIAGNOSIS — Z6841 Body Mass Index (BMI) 40.0 and over, adult: Secondary | ICD-10-CM | POA: Diagnosis not present

## 2016-06-06 NOTE — Progress Notes (Signed)
Bariatric Class:  Appt start time: 5830 end time:  1545  2 Week Post-Operative Nutrition Class  Patient was seen on Sleeve Gastrectomy for Post-Operative Nutrition education at [facility].   Surgery date: 05/21/2016 Surgery type: Sleeve Gastrectomy Start weight at [facility]: 239 lbs on 12/08/2015 Weight today: 224 lbs  Weight change: 16.8 lbs  TANITA  BODY COMP RESULTS  05/09/16 06/06/16   BMI (kg/m^2) 41.3 38.4   Fat Mass (lbs) 124.4 115   Fat Free Mass (lbs) 166.4 109   Total Body Water (lbs) 85.6 79.8   The following the learning objectives were met by the patient during this course:  Identifies Phase 3A (Soft, High Proteins) Dietary Goals and will begin from 2 weeks post-operatively to 2 months post-operatively  Identifies appropriate sources of fluids and proteins   States protein recommendations and appropriate sources post-operatively  Identifies the need for appropriate texture modifications, mastication, and bite sizes when consuming solids  Identifies appropriate multivitamin and calcium sources post-operatively  Describes the need for physical activity post-operatively and will follow MD recommendations  States when to call healthcare provider regarding medication questions or post-operative complications  Handouts given during class include:  Phase 3A: Soft, High Protein Diet Handout  Follow-Up Plan: Patient will follow-up at [facility] in 6 weeks for 2 month post-op nutrition visit for diet advancement per MD.

## 2016-06-08 ENCOUNTER — Other Ambulatory Visit: Payer: Self-pay | Admitting: General Surgery

## 2016-06-08 DIAGNOSIS — R1011 Right upper quadrant pain: Secondary | ICD-10-CM

## 2016-06-12 ENCOUNTER — Encounter (HOSPITAL_COMMUNITY): Payer: Self-pay

## 2016-06-12 ENCOUNTER — Emergency Department (HOSPITAL_COMMUNITY): Payer: BLUE CROSS/BLUE SHIELD

## 2016-06-12 ENCOUNTER — Inpatient Hospital Stay (HOSPITAL_COMMUNITY)
Admission: EM | Admit: 2016-06-12 | Discharge: 2016-06-14 | DRG: 641 | Disposition: A | Discharge status: Home / Self Care | Payer: BLUE CROSS/BLUE SHIELD | Attending: General Surgery | Admitting: General Surgery

## 2016-06-12 DIAGNOSIS — R1111 Vomiting without nausea: Secondary | ICD-10-CM

## 2016-06-12 DIAGNOSIS — R740 Nonspecific elevation of levels of transaminase and lactic acid dehydrogenase [LDH]: Secondary | ICD-10-CM | POA: Diagnosis present

## 2016-06-12 DIAGNOSIS — Z87891 Personal history of nicotine dependence: Secondary | ICD-10-CM

## 2016-06-12 DIAGNOSIS — R112 Nausea with vomiting, unspecified: Secondary | ICD-10-CM | POA: Diagnosis present

## 2016-06-12 DIAGNOSIS — K219 Gastro-esophageal reflux disease without esophagitis: Secondary | ICD-10-CM | POA: Diagnosis present

## 2016-06-12 DIAGNOSIS — Z9884 Bariatric surgery status: Secondary | ICD-10-CM

## 2016-06-12 DIAGNOSIS — Z6839 Body mass index (BMI) 39.0-39.9, adult: Secondary | ICD-10-CM

## 2016-06-12 DIAGNOSIS — J45909 Unspecified asthma, uncomplicated: Secondary | ICD-10-CM | POA: Diagnosis present

## 2016-06-12 DIAGNOSIS — R1011 Right upper quadrant pain: Secondary | ICD-10-CM

## 2016-06-12 DIAGNOSIS — R7989 Other specified abnormal findings of blood chemistry: Secondary | ICD-10-CM | POA: Diagnosis present

## 2016-06-12 DIAGNOSIS — E43 Unspecified severe protein-calorie malnutrition: Secondary | ICD-10-CM | POA: Insufficient documentation

## 2016-06-12 DIAGNOSIS — Z8541 Personal history of malignant neoplasm of cervix uteri: Secondary | ICD-10-CM

## 2016-06-12 DIAGNOSIS — E860 Dehydration: Principal | ICD-10-CM | POA: Diagnosis present

## 2016-06-12 DIAGNOSIS — R1110 Vomiting, unspecified: Secondary | ICD-10-CM | POA: Diagnosis present

## 2016-06-12 LAB — CBC WITH DIFFERENTIAL/PLATELET
BASOS ABS: 0 10*3/uL (ref 0.0–0.1)
BASOS PCT: 0 %
EOS ABS: 0.1 10*3/uL (ref 0.0–0.7)
EOS PCT: 1 %
HCT: 41.5 % (ref 36.0–46.0)
HEMOGLOBIN: 14.5 g/dL (ref 12.0–15.0)
Lymphocytes Relative: 16 %
Lymphs Abs: 1.1 10*3/uL (ref 0.7–4.0)
MCH: 29.1 pg (ref 26.0–34.0)
MCHC: 34.9 g/dL (ref 30.0–36.0)
MCV: 83.3 fL (ref 78.0–100.0)
Monocytes Absolute: 0.6 10*3/uL (ref 0.1–1.0)
Monocytes Relative: 8 %
NEUTROS PCT: 75 %
Neutro Abs: 5.3 10*3/uL (ref 1.7–7.7)
PLATELETS: 218 10*3/uL (ref 150–400)
RBC: 4.98 MIL/uL (ref 3.87–5.11)
RDW: 13.8 % (ref 11.5–15.5)
WBC: 7.1 10*3/uL (ref 4.0–10.5)

## 2016-06-12 LAB — COMPREHENSIVE METABOLIC PANEL
ALBUMIN: 3.6 g/dL (ref 3.5–5.0)
ALT: 130 U/L — AB (ref 14–54)
AST: 191 U/L — AB (ref 15–41)
Alkaline Phosphatase: 88 U/L (ref 38–126)
Anion gap: 16 — ABNORMAL HIGH (ref 5–15)
BUN: 9 mg/dL (ref 6–20)
CHLORIDE: 97 mmol/L — AB (ref 101–111)
CO2: 25 mmol/L (ref 22–32)
CREATININE: 0.88 mg/dL (ref 0.44–1.00)
Calcium: 9.2 mg/dL (ref 8.9–10.3)
GFR calc non Af Amer: >60 mL/min (ref >60)
GLUCOSE: 83 mg/dL (ref 65–99)
Potassium: 3 mmol/L — ABNORMAL LOW (ref 3.5–5.1)
SODIUM: 138 mmol/L (ref 135–145)
Total Bilirubin: 1.6 mg/dL — ABNORMAL HIGH (ref 0.3–1.2)
Total Protein: 6.8 g/dL (ref 6.5–8.1)

## 2016-06-12 LAB — URINALYSIS, ROUTINE W REFLEX MICROSCOPIC
Glucose, UA: NEGATIVE mg/dL
Hgb urine dipstick: NEGATIVE
Ketones, ur: >80 mg/dL — AB
Nitrite: NEGATIVE
Protein, ur: NEGATIVE mg/dL
Specific Gravity, Urine: 1.017 (ref 1.005–1.030)
pH: 6 (ref 5.0–8.0)

## 2016-06-12 LAB — URINE MICROSCOPIC-ADD ON

## 2016-06-12 LAB — LIPASE, BLOOD: Lipase: 17 U/L (ref 11–51)

## 2016-06-12 LAB — I-STAT CG4 LACTIC ACID, ED: Lactic Acid, Venous: 1.43 mmol/L (ref 0.5–1.9)

## 2016-06-12 MED ORDER — THIAMINE HCL 100 MG/ML IJ SOLN
Freq: Once | INTRAVENOUS | Status: AC
  Administered 2016-06-12: 10:00:00 via INTRAVENOUS
  Filled 2016-06-12: qty 1000

## 2016-06-12 MED ORDER — GABAPENTIN 300 MG PO CAPS
300.0000 mg | ORAL_CAPSULE | Freq: Every day | ORAL | Status: DC
  Administered 2016-06-12 – 2016-06-14 (×6): 300 mg via ORAL
  Filled 2016-06-12 (×6): qty 1

## 2016-06-12 MED ORDER — SODIUM CHLORIDE 0.9 % IV BOLUS (SEPSIS)
1000.0000 mL | Freq: Once | INTRAVENOUS | Status: AC
  Administered 2016-06-12: 1000 mL via INTRAVENOUS

## 2016-06-12 MED ORDER — PROMETHAZINE HCL 25 MG/ML IJ SOLN
12.5000 mg | Freq: Four times a day (QID) | INTRAMUSCULAR | Status: DC | PRN
  Administered 2016-06-13: 12.5 mg via INTRAVENOUS
  Filled 2016-06-12: qty 1

## 2016-06-12 MED ORDER — ENOXAPARIN SODIUM 40 MG/0.4ML ~~LOC~~ SOLN
40.0000 mg | SUBCUTANEOUS | Status: DC
Start: 2016-06-12 — End: 2016-06-13
  Administered 2016-06-12: 40 mg via SUBCUTANEOUS
  Filled 2016-06-12: qty 0.4

## 2016-06-12 MED ORDER — FENTANYL CITRATE (PF) 100 MCG/2ML IJ SOLN
50.0000 ug | INTRAMUSCULAR | Status: AC | PRN
  Administered 2016-06-12 (×3): 50 ug via INTRAVENOUS
  Filled 2016-06-12 (×3): qty 2

## 2016-06-12 MED ORDER — ONDANSETRON HCL 4 MG/2ML IJ SOLN
4.0000 mg | Freq: Four times a day (QID) | INTRAMUSCULAR | Status: DC | PRN
  Administered 2016-06-12 – 2016-06-13 (×4): 4 mg via INTRAVENOUS
  Filled 2016-06-12 (×5): qty 2

## 2016-06-12 MED ORDER — FAMOTIDINE IN NACL 20-0.9 MG/50ML-% IV SOLN
20.0000 mg | Freq: Two times a day (BID) | INTRAVENOUS | Status: DC
  Administered 2016-06-12 – 2016-06-14 (×4): 20 mg via INTRAVENOUS
  Filled 2016-06-12 (×4): qty 50

## 2016-06-12 MED ORDER — ONDANSETRON HCL 4 MG/2ML IJ SOLN
4.0000 mg | Freq: Once | INTRAMUSCULAR | Status: AC
  Administered 2016-06-12: 4 mg via INTRAVENOUS
  Filled 2016-06-12: qty 2

## 2016-06-12 MED ORDER — ALBUTEROL SULFATE (2.5 MG/3ML) 0.083% IN NEBU
3.0000 mL | INHALATION_SOLUTION | RESPIRATORY_TRACT | Status: DC | PRN
Start: 2016-06-12 — End: 2016-06-14

## 2016-06-12 MED ORDER — DIPHENHYDRAMINE HCL 12.5 MG/5ML PO ELIX
12.5000 mg | ORAL_SOLUTION | Freq: Four times a day (QID) | ORAL | Status: DC | PRN
Start: 2016-06-12 — End: 2016-06-14
  Administered 2016-06-12 – 2016-06-13 (×2): 12.5 mg via ORAL
  Filled 2016-06-12 (×3): qty 5

## 2016-06-12 MED ORDER — DIPHENHYDRAMINE HCL 50 MG/ML IJ SOLN
12.5000 mg | Freq: Four times a day (QID) | INTRAMUSCULAR | Status: DC | PRN
  Administered 2016-06-14 (×2): 12.5 mg via INTRAVENOUS
  Filled 2016-06-12 (×2): qty 1

## 2016-06-12 MED ORDER — IOPAMIDOL (ISOVUE-300) INJECTION 61%
100.0000 mL | Freq: Once | INTRAVENOUS | Status: AC | PRN
  Administered 2016-06-12: 100 mL via INTRAVENOUS

## 2016-06-12 MED ORDER — KCL IN DEXTROSE-NACL 20-5-0.45 MEQ/L-%-% IV SOLN
INTRAVENOUS | Status: DC
  Administered 2016-06-12 – 2016-06-14 (×4): via INTRAVENOUS
  Administered 2016-06-14: 1000 mL via INTRAVENOUS
  Filled 2016-06-12 (×6): qty 1000

## 2016-06-12 MED ORDER — FLUOXETINE HCL 20 MG PO CAPS
80.0000 mg | ORAL_CAPSULE | Freq: Every day | ORAL | Status: DC
  Administered 2016-06-12 – 2016-06-13 (×2): 80 mg via ORAL
  Filled 2016-06-12 (×2): qty 4

## 2016-06-12 MED ORDER — FAMOTIDINE IN NACL 20-0.9 MG/50ML-% IV SOLN
20.0000 mg | Freq: Once | INTRAVENOUS | Status: AC
  Administered 2016-06-12: 20 mg via INTRAVENOUS
  Filled 2016-06-12: qty 50

## 2016-06-12 MED ORDER — ONDANSETRON 4 MG PO TBDP: 4.0000 mg | ORAL_TABLET | Freq: Four times a day (QID) | ORAL | Status: DC | PRN

## 2016-06-12 MED ORDER — IOPAMIDOL (ISOVUE-300) INJECTION 61%
15.0000 mL | Freq: Once | INTRAVENOUS | Status: AC | PRN
Start: 2016-06-12 — End: 2016-06-12
  Administered 2016-06-12: 15 mL via ORAL

## 2016-06-12 MED ORDER — HYDROMORPHONE HCL 1 MG/ML IJ SOLN
1.0000 mg | INTRAMUSCULAR | Status: DC | PRN
  Administered 2016-06-12 – 2016-06-13 (×5): 1 mg via INTRAVENOUS
  Filled 2016-06-12 (×5): qty 1

## 2016-06-12 NOTE — ED Notes (Signed)
Pt can go to floor at 17:13.

## 2016-06-12 NOTE — ED Triage Notes (Signed)
Pt states that she can not keep anything down and has been vomiting  yellopw emesis and 3 times today onset occurred on Saturday. Pt is s/p gastric sleeve procedure on 10//02/17.

## 2016-06-12 NOTE — ED Notes (Signed)
RN is starting IV and collecting blood

## 2016-06-12 NOTE — ED Notes (Signed)
Pt reports having bloody metallic taste in mouth for several days now.

## 2016-06-12 NOTE — H&P (Signed)
Tracie Johnson is an 35 y.o. female.   Chief Complaint: vomiting; HPI: 35 yo WF s/p laparoscopic sleeve gastrectomy 10/2 who came to ED for worsening oral intake and ongoing dry heaving since saturaday. Pt seen in clinic late last week. Pt was taking in adequate liquids at that time but having some epigastric discomfort but more right sided discomfort along with the typical indigestion and mild reflux.  She was not necessarily tender at the sleeve gastrectomy extraction site in her right abdominal wall.  Therefore I recommended that she stay on liquids and not advance to solids and we made plans to get an outpatient right upper quadrant ultrasound to evaluate her gallbladder.  She denies any fever, chills, left upper quadrant or left shoulder pain.   Past Medical History:  Diagnosis Date  . Asthma    allergy induced asthma   . Cancer (HCC)    hx of cervical cancer   . GERD (gastroesophageal reflux disease)   . PONV (postoperative nausea and vomiting)     Past Surgical History:  Procedure Laterality Date  . BACK SURGERY     x 2   . BREAST ENHANCEMENT SURGERY    . LAPAROSCOPIC GASTRIC SLEEVE RESECTION N/A 05/21/2016   Procedure: LAPAROSCOPIC GASTRIC SLEEVE RESECTION, UPPER ENDOSCOPY;  Surgeon: [name], MD;  Location: [facility];  Service: General;  Laterality: N/A;  . LEEP surgery     . TUBAL LIGATION    . tummy tuck surgery    . uterine ablation       No family history on file. Social History:  reports that she has quit smoking. She has never used smokeless tobacco. She reports that she does not drink alcohol or use drugs.  Allergies: No Known Allergies   (Not in a hospital admission)  Results for orders placed or performed during the hospital encounter of 06/12/16 (from the past 48 hour(s))  CBC with Differential     Status: None   Collection Time: 06/12/16  7:45 AM  Result Value Ref Range   WBC 7.1 4.0 - 10.5 K/uL   RBC 4.98 3.87 - 5.11 MIL/uL   Hemoglobin 14.5 12.0 - 15.0 g/dL    HCT 41.5 36.0 - 46.0 %   MCV 83.3 78.0 - 100.0 fL   MCH 29.1 26.0 - 34.0 pg   MCHC 34.9 30.0 - 36.0 g/dL   RDW 13.8 11.5 - 15.5 %   Platelets 218 150 - 400 K/uL   Neutrophils Relative % 75 %   Neutro Abs 5.3 1.7 - 7.7 K/uL   Lymphocytes Relative 16 %   Lymphs Abs 1.1 0.7 - 4.0 K/uL   Monocytes Relative 8 %   Monocytes Absolute 0.6 0.1 - 1.0 K/uL   Eosinophils Relative 1 %   Eosinophils Absolute 0.1 0.0 - 0.7 K/uL   Basophils Relative 0 %   Basophils Absolute 0.0 0.0 - 0.1 K/uL  Comprehensive metabolic panel     Status: Abnormal   Collection Time: 06/12/16  7:45 AM  Result Value Ref Range   Sodium 138 135 - 145 mmol/L   Potassium 3.0 (L) 3.5 - 5.1 mmol/L   Chloride 97 (L) 101 - 111 mmol/L   CO2 25 22 - 32 mmol/L   Glucose, Bld 83 65 - 99 mg/dL   BUN 9 6 - 20 mg/dL   Creatinine, Ser 0.88 0.44 - 1.00 mg/dL   Calcium 9.2 8.9 - 10.3 mg/dL   Total Protein 6.8 6.5 - 8.1 g/dL   Albumin  3.6 3.5 - 5.0 g/dL   AST 191 (H) 15 - 41 U/L   ALT 130 (H) 14 - 54 U/L   Alkaline Phosphatase 88 38 - 126 U/L   Total Bilirubin 1.6 (H) 0.3 - 1.2 mg/dL   GFR calc non Af Amer >60 >60 mL/min   GFR calc Af Amer >60 >60 mL/min    Comment: (NOTE) The eGFR has been calculated using the CKD EPI equation. This calculation has not been validated in all clinical situations. eGFR's persistently <60 mL/min signify possible Chronic Kidney Disease.    Anion gap 16 (H) 5 - 15  Lipase, blood     Status: None   Collection Time: 06/12/16  7:45 AM  Result Value Ref Range   Lipase 17 11 - 51 U/L  I-Stat CG4 Lactic Acid, ED     Status: None   Collection Time: 06/12/16  7:54 AM  Result Value Ref Range   Lactic Acid, Venous 1.43 0.5 - 1.9 mmol/L  Urinalysis, Routine w reflex microscopic (not at [facility])     Status: Abnormal   Collection Time: 06/12/16  8:48 AM  Result Value Ref Range   Color, Urine AMBER (A) YELLOW    Comment: BIOCHEMICALS MAY BE AFFECTED BY COLOR   APPearance CLOUDY (A) CLEAR   Specific  Gravity, Urine 1.017 1.005 - 1.030   pH 6.0 5.0 - 8.0   Glucose, UA NEGATIVE NEGATIVE mg/dL   Hgb urine dipstick NEGATIVE NEGATIVE   Bilirubin Urine LARGE (A) NEGATIVE   Ketones, ur >80 (A) NEGATIVE mg/dL   Protein, ur NEGATIVE NEGATIVE mg/dL   Nitrite NEGATIVE NEGATIVE   Leukocytes, UA TRACE (A) NEGATIVE  Urine microscopic-add on     Status: Abnormal   Collection Time: 06/12/16  8:48 AM  Result Value Ref Range   Squamous Epithelial / LPF 0-5 (A) NONE SEEN   WBC, UA 0-5 0 - 5 WBC/hpf   RBC / HPF 0-5 0 - 5 RBC/hpf   Bacteria, UA MANY (A) NONE SEEN   Casts HYALINE CASTS (A) NEGATIVE   Ct Abdomen Pelvis W Contrast  Result Date: 06/12/2016 CLINICAL DATA:  Vomiting. EXAM: CT ABDOMEN AND PELVIS WITH CONTRAST TECHNIQUE: Multidetector CT imaging of the abdomen and pelvis was performed using the standard protocol following bolus administration of intravenous contrast. CONTRAST:  118m ISOVUE-300 IOPAMIDOL (ISOVUE-300) INJECTION 61% COMPARISON:  None. FINDINGS: Lower chest: Visualized lung bases are unremarkable. Hepatobiliary: No gallstones are noted.  Liver appears normal. Pancreas: Normal. Spleen: Normal. Adrenals/Urinary Tract: Adrenal glands and kidneys appear normal. No hydronephrosis or renal obstruction is noted. No renal or ureteral calculi are noted. Urinary bladder appears normal. Stomach/Bowel: Status post gastric surgery. There is no evidence of bowel obstruction. The appendix appears normal. Vascular/Lymphatic: No significant adenopathy is noted. Reproductive: Uterus and ovaries are unremarkable. Other: Small amount of free fluid is noted in the adnexal regions bilaterally which most likely is physiologic. Musculoskeletal: Status post surgical posterior fusion of the L4 through S1. IMPRESSION: Status post gastric surgery. Postsurgical changes seen in lumbar spine. No acute abnormality seen in the abdomen or pelvis. Electronically Signed   By: JMarijo Conception M.D.   On: 06/12/2016 10:27     Review of Systems  Constitutional: Negative for chills and fever.  Gastrointestinal: Positive for abdominal pain, heartburn, nausea and vomiting.  Genitourinary: Negative for dysuria and hematuria.  All other systems reviewed and are negative.   Blood pressure 108/63, pulse 75, temperature 98.1 F (36.7 C), temperature source Oral,  resp. rate 16, SpO2 95 %. Physical Exam  Vitals reviewed. Constitutional: She is oriented to person, place, and time. She appears well-developed and well-nourished. No distress.  nontoxic  HENT:  Head: Normocephalic and atraumatic.  Right Ear: External ear normal.  Left Ear: External ear normal.  Eyes: Conjunctivae are normal. No scleral icterus.  Neck: Normal range of motion. Neck supple. No tracheal deviation present. No thyromegaly present.  Cardiovascular: Normal rate and normal heart sounds.   Respiratory: Effort normal and breath sounds normal. No stridor. No respiratory distress. She has no wheezes.  GI: Soft. She exhibits no distension. There is no rebound and no guarding.  Incisions c/d/i; very mild Right abd TTP; no rebound/guarding.   Musculoskeletal: She exhibits no edema or tenderness.  Lymphadenopathy:    She has no cervical adenopathy.  Neurological: She is alert and oriented to person, place, and time. She exhibits normal muscle tone.  Skin: Skin is warm and dry. No rash noted. She is not diaphoretic. No erythema. No pallor.  Psychiatric: She has a normal mood and affect. Her behavior is normal. Judgment and thought content normal.     Assessment/Plan Status post sleeve gastrectomy October 2 Epigastric pain Right upper quadrant pain Elevated LFTs Dehydration Vomiting  She appears a little bit dehydrated at least on blood test evaluation given her increased hemoglobin and hematocrit compared to discharge as well as ketones in her urine.  I asked the ER physician earlier this morning to give her 2 L of IV fluids.  She will also be  given on IV banana bag.  She has no  Elevated white blood cell count.  I think the best imaging to start with would be a CT scan.  We will give her pain medication, antinausea medicine and admitted for IV fluid resuscitation and further workup of her right-sided pain.  The epigastric discomfort is not a typical after a sleeve gastrectomy.  Leighton Ruff. Redmond Pulling, MD, [place], Bariatric, & Minimally Invasive Surgery [facility], [place]   Gayland Curry, MD 06/12/2016, 10:34 AM

## 2016-06-12 NOTE — ED Provider Notes (Signed)
[name]   CSN: [id number] Arrival date & time: 06/12/16  [id number]     History   Chief Complaint Chief Complaint  Patient presents with  . Abdominal Pain  . Nausea    HPI Tracie Johnson is a 35 y.o. female.  The history is provided by the patient.  Abdominal Pain   This is a new problem. Episode onset: 3 days ago. The problem occurs constantly (was intermittent until yesterday). The problem has been gradually worsening. The pain is associated with a previous surgery (gastric sleeve 3 weeks ago). The pain is located in the periumbilical region (right flank). The quality of the pain is cramping and sharp. The pain is moderate. Associated symptoms include anorexia, nausea and vomiting (3x/day). Pertinent negatives include fever, diarrhea, hematochezia and melena. Nothing aggravates the symptoms. Nothing relieves the symptoms.    Past Medical History:  Diagnosis Date  . Asthma    allergy induced asthma   . Cancer (HCC)    hx of cervical cancer   . GERD (gastroesophageal reflux disease)   . PONV (postoperative nausea and vomiting)     Patient Active Problem List   Diagnosis Date Noted  . Obesity, Class III, BMI 40-49.9 (morbid obesity) ([place]) 05/21/2016  . Lumbosacral pain, chronic 05/21/2016  . History of prediabetes 05/21/2016  . GERD (gastroesophageal reflux disease) 05/21/2016  . PCOS (polycystic ovarian syndrome) 05/21/2016  . 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 S/P laparoscopic sleeve gastrectomy 05/21/2016    Past Surgical History:  Procedure Laterality Date  . BACK SURGERY     x 2   . BREAST ENHANCEMENT SURGERY    . LAPAROSCOPIC GASTRIC SLEEVE RESECTION N/A 05/21/2016   Procedure: LAPAROSCOPIC GASTRIC SLEEVE RESECTION, UPPER ENDOSCOPY;  Surgeon: [name], MD;  Location: [facility];  Service: General;  Laterality: N/A;  . LEEP surgery     . TUBAL LIGATION    . tummy tuck surgery    . uterine ablation       OB History     No data available       Home Medications    Prior to Admission medications   Medication Sig Start Date End Date Taking? Authorizing Provider  FLUoxetine (PROZAC) 20 MG capsule Take 80 mg by mouth at bedtime.  03/06/16   Historical Provider, MD  gabapentin (NEURONTIN) 300 MG capsule Take 300 mg by mouth 5 (five) times daily. 03/15/16   Historical Provider, MD  oxyCODONE (ROXICODONE) 5 MG/5ML solution Take 5-10 mLs (5-10 mg total) by mouth every 4 (four) hours as needed for moderate pain or severe pain. 05/22/16   [name], MD    Family History No family history on file.  Social History Social History  Substance Use Topics  . Smoking status: Former Research scientist (life sciences)  . Smokeless tobacco: Never Used  . Alcohol use No     Allergies   Review of patient's allergies indicates no known allergies.   Review of Systems Review of Systems  Constitutional: Negative for fever.  Gastrointestinal: Positive for abdominal pain, anorexia, nausea and vomiting (3x/day). Negative for diarrhea, hematochezia and melena.  All other systems reviewed and are negative.    Physical Exam Updated Vital Signs BP 133/74 (BP Location: Left Arm)   Pulse 89   Temp 98.1 F (36.7 C) (Oral)   Resp 18   SpO2 96%   Physical Exam  Constitutional: She is oriented to person, place, and time. She appears well-developed and well-nourished. No distress.  HENT:  Head: Normocephalic.  Nose: Nose normal.  Eyes: Conjunctivae are normal.  Neck: Neck supple. No tracheal deviation present.  Cardiovascular: Normal rate, regular rhythm and normal heart sounds.   Pulmonary/Chest: Effort normal and breath sounds normal. No respiratory distress.  Abdominal: Soft. She exhibits no distension.  Well-healed laparoscopic surgical incisions with tenderness and guarding in the upper quadrants. No peritonitis.  Neurological: She is alert and oriented to person, place, and time.  Skin: Skin is warm and dry.  Psychiatric: She has a  normal mood and affect.  Vitals reviewed.    ED Treatments / Results  Labs (all labs ordered are listed, but only abnormal results are displayed) Labs Reviewed  COMPREHENSIVE METABOLIC PANEL - Abnormal; Notable for the following:       Result Value   Potassium 3.0 (*)    Chloride 97 (*)    AST 191 (*)    ALT 130 (*)    Total Bilirubin 1.6 (*)    Anion gap 16 (*)    All other components within normal limits  URINALYSIS, ROUTINE W REFLEX MICROSCOPIC (NOT AT [facility]) - Abnormal; Notable for the following:    Color, Urine AMBER (*)    APPearance CLOUDY (*)    Bilirubin Urine LARGE (*)    Ketones, ur >80 (*)    Leukocytes, UA TRACE (*)    All other components within normal limits  URINE MICROSCOPIC-ADD ON - Abnormal; Notable for the following:    Squamous Epithelial / LPF 0-5 (*)    Bacteria, UA MANY (*)    Casts HYALINE CASTS (*)    All other components within normal limits  CBC WITH DIFFERENTIAL/PLATELET  LIPASE, BLOOD  I-STAT CG4 LACTIC ACID, ED    EKG  EKG Interpretation None       Radiology No results found.  Procedures Procedures (including critical care time)  Medications Ordered in ED Medications  sodium chloride 0.9 % bolus 1,000 mL (not administered)     Initial Impression / Assessment and Plan / ED Course  I have reviewed the triage vital signs and the nursing notes.  Pertinent labs & imaging results that were available during my care of the patient were reviewed by me and considered in my medical decision making (see chart for details).  Clinical Course   [name] is primary surgeon  35 y.o. female presents with Abdominal pain that is above her umbilicus that goes to the right side of her abdomen. She has been having vomiting this weekend and 3 times this morning with decreased ability to keep down fluids. Dr. Redmond Pulling was available to discuss the patient soon after arrival and agreed with CT scan to evaluate for possible surgical complication,  obstruction, or gallstones given the patient's location of pain.  CT scan shows postoperative anatomy with no acute findings to explain the patient's symptoms. She does have a slight elevation in her transaminases which may be secondary to the emesis episodes or a viral syndrome. Dr. Redmond Pulling has placed admission orders for observation.  Final Clinical Impressions(s) / ED Diagnoses   Final diagnoses:  Vomiting without nausea, intractability of vomiting not specified, unspecified vomiting type    New Prescriptions New Prescriptions   No medications on file     Leo Grosser, MD 06/12/16 1206

## 2016-06-13 ENCOUNTER — Observation Stay (HOSPITAL_COMMUNITY): Payer: BLUE CROSS/BLUE SHIELD

## 2016-06-13 DIAGNOSIS — Z87891 Personal history of nicotine dependence: Secondary | ICD-10-CM | POA: Diagnosis not present

## 2016-06-13 DIAGNOSIS — Z6839 Body mass index (BMI) 39.0-39.9, adult: Secondary | ICD-10-CM | POA: Diagnosis not present

## 2016-06-13 DIAGNOSIS — Z8541 Personal history of malignant neoplasm of cervix uteri: Secondary | ICD-10-CM | POA: Diagnosis not present

## 2016-06-13 DIAGNOSIS — R7989 Other specified abnormal findings of blood chemistry: Secondary | ICD-10-CM | POA: Diagnosis present

## 2016-06-13 DIAGNOSIS — R1111 Vomiting without nausea: Secondary | ICD-10-CM | POA: Diagnosis present

## 2016-06-13 DIAGNOSIS — R1011 Right upper quadrant pain: Secondary | ICD-10-CM | POA: Diagnosis present

## 2016-06-13 DIAGNOSIS — R112 Nausea with vomiting, unspecified: Secondary | ICD-10-CM | POA: Diagnosis present

## 2016-06-13 DIAGNOSIS — K219 Gastro-esophageal reflux disease without esophagitis: Secondary | ICD-10-CM | POA: Diagnosis present

## 2016-06-13 DIAGNOSIS — J45909 Unspecified asthma, uncomplicated: Secondary | ICD-10-CM | POA: Diagnosis present

## 2016-06-13 DIAGNOSIS — Z9884 Bariatric surgery status: Secondary | ICD-10-CM | POA: Diagnosis not present

## 2016-06-13 DIAGNOSIS — R740 Nonspecific elevation of levels of transaminase and lactic acid dehydrogenase [LDH]: Secondary | ICD-10-CM | POA: Diagnosis present

## 2016-06-13 DIAGNOSIS — E860 Dehydration: Secondary | ICD-10-CM | POA: Diagnosis present

## 2016-06-13 LAB — COMPREHENSIVE METABOLIC PANEL
ALT: 126 U/L — AB (ref 14–54)
AST: 181 U/L — AB (ref 15–41)
Albumin: 3.1 g/dL — ABNORMAL LOW (ref 3.5–5.0)
Alkaline Phosphatase: 75 U/L (ref 38–126)
Anion gap: 12 (ref 5–15)
BILIRUBIN TOTAL: 1.2 mg/dL (ref 0.3–1.2)
CO2: 25 mmol/L (ref 22–32)
CREATININE: 0.72 mg/dL (ref 0.44–1.00)
Calcium: 8.6 mg/dL — ABNORMAL LOW (ref 8.9–10.3)
Chloride: 102 mmol/L (ref 101–111)
Glucose, Bld: 145 mg/dL — ABNORMAL HIGH (ref 65–99)
Potassium: 3.1 mmol/L — ABNORMAL LOW (ref 3.5–5.1)
Sodium: 139 mmol/L (ref 135–145)
TOTAL PROTEIN: 5.9 g/dL — AB (ref 6.5–8.1)

## 2016-06-13 LAB — CBC
HCT: 37.2 % (ref 36.0–46.0)
Hemoglobin: 13.1 g/dL (ref 12.0–15.0)
MCH: 28.7 pg (ref 26.0–34.0)
MCHC: 35.2 g/dL (ref 30.0–36.0)
MCV: 81.4 fL (ref 78.0–100.0)
PLATELETS: 187 10*3/uL (ref 150–400)
RBC: 4.57 MIL/uL (ref 3.87–5.11)
RDW: 13.9 % (ref 11.5–15.5)
WBC: 5 10*3/uL (ref 4.0–10.5)

## 2016-06-13 LAB — MAGNESIUM: MAGNESIUM: 1.6 mg/dL — AB (ref 1.7–2.4)

## 2016-06-13 MED ORDER — MAGNESIUM SULFATE 2 GM/50ML IV SOLN
2.0000 g | Freq: Once | INTRAVENOUS | Status: AC
  Administered 2016-06-13: 2 g via INTRAVENOUS
  Filled 2016-06-13: qty 50

## 2016-06-13 MED ORDER — POTASSIUM CHLORIDE 20 MEQ/15ML (10%) PO SOLN
40.0000 meq | Freq: Once | ORAL | Status: AC
  Administered 2016-06-13: 40 meq via ORAL
  Filled 2016-06-13: qty 30

## 2016-06-13 MED ORDER — SINCALIDE 5 MCG IJ SOLR
0.0200 ug/kg | Freq: Once | INTRAMUSCULAR | Status: AC
  Administered 2016-06-13: 2 ug via INTRAVENOUS
  Filled 2016-06-13: qty 5

## 2016-06-13 MED ORDER — KETOROLAC TROMETHAMINE 15 MG/ML IJ SOLN
15.0000 mg | Freq: Three times a day (TID) | INTRAMUSCULAR | Status: DC | PRN
  Administered 2016-06-13: 15 mg via INTRAVENOUS
  Filled 2016-06-13 (×2): qty 1

## 2016-06-13 MED ORDER — OXYCODONE HCL 5 MG/5ML PO SOLN
5.0000 mg | ORAL | Status: DC | PRN
  Administered 2016-06-14: 5 mg via ORAL
  Filled 2016-06-13: qty 5

## 2016-06-13 MED ORDER — TECHNETIUM TC 99M MEBROFENIN IV KIT
5.4200 | PACK | Freq: Once | INTRAVENOUS | Status: AC | PRN
  Administered 2016-06-13: 5.42 via INTRAVENOUS

## 2016-06-13 MED ORDER — ENOXAPARIN SODIUM 30 MG/0.3ML ~~LOC~~ SOLN
30.0000 mg | Freq: Two times a day (BID) | SUBCUTANEOUS | Status: DC
  Administered 2016-06-13 – 2016-06-14 (×2): 30 mg via SUBCUTANEOUS
  Filled 2016-06-13 (×2): qty 0.3

## 2016-06-13 MED ORDER — POLYETHYLENE GLYCOL 3350 17 G PO PACK
17.0000 g | PACK | Freq: Every day | ORAL | Status: DC
  Administered 2016-06-14: 17 g via ORAL
  Filled 2016-06-13: qty 1

## 2016-06-13 NOTE — Progress Notes (Signed)
Patient alert and oriented, provided support and encouragement.  Encouraged ambulation and small sips of liquids once diet has been advanced.  All questions answered.  Will continue to monitor.

## 2016-06-13 NOTE — Progress Notes (Signed)
Pt complaining of intermittent nausea throughout shift. Also complaining of reflux that gets worse after medications.  Due to continue use of antiemetics and pt only having some ice chips, did not advance diet.  Will continue to monitor.  Tracie Johnson

## 2016-06-13 NOTE — Progress Notes (Signed)
Initial Nutrition Assessment  DOCUMENTATION CODES:   Obesity unspecified  INTERVENTION:   Monitor magnesium, potassium, and phosphorus daily for at least 3 days, MD to replete as needed, as pt is at risk for refeeding syndrome given N/V since surgery on 10/2, pt not meeting nutrition requirements post surgery (2-3 weeks) d/t persistent N/V, and Low Mg and K levels.  Recommend obtaining current weight (last recorded 10/18). Diet advancement per surgery Will continue to monitor for needs  NUTRITION DIAGNOSIS:   Inadequate oral intake related to vomiting, nausea as evidenced by meal completion < 25%, per patient/family report.  GOAL:   Patient will meet greater than or equal to 90% of their needs  MONITOR:   Diet advancement, Labs, Weight trends, I & O's  REASON FOR ASSESSMENT:   Malnutrition Screening Tool    ASSESSMENT:   35 yo WF s/p laparoscopic sleeve gastrectomy 10/2 who came to ED for worsening oral intake and ongoing dry heaving since saturaday. Pt seen in clinic late last week. Pt was taking in adequate liquids at that time but having some epigastric discomfort but more right sided discomfort along with the typical indigestion and mild reflux.  She was not necessarily tender at the sleeve gastrectomy extraction site in her right abdominal wall.  Therefore I recommended that she stay on liquids and not advance to solids and we made plans to get an outpatient right upper quadrant ultrasound to evaluate her gallbladder.   Patient in room with no family present. Pt states that since discharging from the hospital s/p sleeve gastrectomy on 10/2, she has had persistent N/V. She has been unable to tolerate solid foods and liquids including water. She has been experiencing acid reflux and states that Zantac was not helping. Pt has been taking her recommended vitamins but they would come back up. Patient states the nausea has continued, she tried to consume some chicken broth last  night and did not do well with it. Pt now NPO for HIDA scan. Will continue to monitor for diet advancement and supplemental needs. Per chart review, last weight documented is from 10/18, at that time pt had 18 lb weight loss. Unable to determine how much if from surgery and how much from inadequate oral intake. Nutrition focused physical exam shows no sign of depletion of muscle mass or body fat.  Medications: D5 and .45% NaCl w/ KCl infusion at 125 ml/hr -provides 510 kcal, Zofran IV PRN Labs reviewed: Low K, Mg  Diet Order:  Diet NPO time specified Except for: Sips with Meds  Skin:  Wound (see comment) (abdominal incision from 10/2)  Last BM:  10/22  Height:   Ht Readings from Last 1 Encounters:  06/06/16 5\' 4"  (1.626 m)    Weight:   Wt Readings from Last 1 Encounters:  06/06/16 224 lb (101.6 kg)    Ideal Body Weight:  54.5 kg  BMI:  38.5 kg/m^2  Estimated Nutritional Needs:   Kcal:  1650-1850  Protein:  65-75g  Fluid:  1.7-1.9L/day  EDUCATION NEEDS:   No education needs identified at this time  Clayton Bibles, MS, RD, LDN Pager: [phone] After Hours Pager: [phone]

## 2016-06-13 NOTE — Progress Notes (Signed)
Paged CCs, Dr. Marlou Starks called said to keep patient NPO until her Dr. Orbie Hurst with her tomorrow morning. No sips with meds or ice chips. [place]

## 2016-06-13 NOTE — Progress Notes (Signed)
Patient ID: Tracie Johnson, female   DOB: May 25, 1981, 35 y.o.   MRN: [id number]  Progress Note: Metabolic and Bariatric Surgery Service   Subjective: C/o epigastric pain and indigestion. Has bile taste in back of mouth occasionally. Drank water last night. Also still has RUQ/lateral pain. Not really around sleeve extraction site.   Objective: Vital signs in last 24 hours: Temp:  [97.9 F (36.6 C)-98.7 F (37.1 C)] 98 F (36.7 C) (10/25 0524) Pulse Rate:  [59-75] 68 (10/25 0524) Resp:  [14-18] 14 (10/25 0524) BP: (98-108)/(47-76) 98/61 (10/25 0524) SpO2:  [95 %-100 %] 100 % (10/25 0524) Last BM Date: 06/10/16  Intake/Output from previous day: 10/24 0701 - 10/25 0700 In: 1833.3 [I.V.:783.3; IV Piggyback:1050] Out: 1050 [Urine:1050] Intake/Output this shift: No intake/output data recorded.  Lungs: cta  Cardiovascular: reg  Abd: soft, nd, not really tender  Extremities: no edema  Neuro: approp, alert, not ill appearing  Lab Results: CBC   Recent Labs  06/12/16 0745 06/13/16 0446  WBC 7.1 5.0  HGB 14.5 13.1  HCT 41.5 37.2  PLT 218 187   BMET  Recent Labs  06/12/16 0745 06/13/16 0446  NA 138 139  K 3.0* 3.1*  CL 97* 102  CO2 25 25  GLUCOSE 83 145*  BUN 9 <5*  CREATININE 0.88 0.72  CALCIUM 9.2 8.6*   PT/INR No results for input(s): LABPROT, INR in the last 72 hours. ABG No results for input(s): PHART, HCO3 in the last 72 hours.  Invalid input(s): PCO2, PO2  Studies/Results:  Anti-infectives: Anti-infectives    None      Medications: Scheduled Meds: . enoxaparin (LOVENOX) injection  40 mg Subcutaneous Q24H  . famotidine (PEPCID) IV  20 mg Intravenous Q12H  . FLUoxetine  80 mg Oral QHS  . gabapentin  300 mg Oral 5 X Daily  . magnesium sulfate 1 - 4 g bolus IVPB  2 g Intravenous Once  . polyethylene glycol  17 g Oral Daily  . potassium chloride  40 mEq Oral Once   Continuous Infusions: . dextrose 5 % and 0.45 % NaCl with KCl 20 mEq/L 125 mL/hr  at 06/13/16 0412   PRN Meds:.albuterol, diphenhydrAMINE **OR** diphenhydrAMINE, HYDROmorphone (DILAUDID) injection, ondansetron **OR** ondansetron (ZOFRAN) IV, oxyCODONE, promethazine  Assessment/Plan: Patient Active Problem List   Diagnosis Date Noted  . Vomiting 06/12/2016  . Obesity, Class III, BMI 40-49.9 (morbid obesity) ([place]) 05/21/2016  . Lumbosacral pain, chronic 05/21/2016  . History of prediabetes 05/21/2016  . GERD (gastroesophageal reflux disease) 05/21/2016  . PCOS (polycystic ovarian syndrome) 05/21/2016  . 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 S/P laparoscopic sleeve gastrectomy 05/21/2016   s/p    Sleeve gastrectomy 10/2 Epigastric/RUQ pain Dehydration Hypokalemia Hypomagnesia Elevated transaminases  CT was negative for leak. Contrast into small bowel. GB grossly normal on CT. The epigastric pain/indigestion is not uncommon after sleeve so will just to try medicate and control symptoms and should improve with time. Discussed with pt.   The RUQ pain and mildly elevated transaminases is a tad atypical after sleeve. Initially thought elevated ast/alt could be due to dehydration and may still be so but will check HIDA to further evaluate GB.   No wbc Replace k/mag Change chemical vte prophylaxis Have to make npo for HIDA.   Disposition:  LOS: 0 days  The patient does not meet criteria for discharge because She has uncontrolled pain and is requiring intravenous medications  Gayland Curry, MD [phone] [facility]

## 2016-06-14 DIAGNOSIS — E43 Unspecified severe protein-calorie malnutrition: Secondary | ICD-10-CM | POA: Insufficient documentation

## 2016-06-14 LAB — COMPREHENSIVE METABOLIC PANEL
ALBUMIN: 2.7 g/dL — AB (ref 3.5–5.0)
ALT: 136 U/L — ABNORMAL HIGH (ref 14–54)
ANION GAP: 5 (ref 5–15)
AST: 214 U/L — AB (ref 15–41)
Alkaline Phosphatase: 68 U/L (ref 38–126)
BUN: <5 mg/dL — ABNORMAL LOW (ref 6–20)
CHLORIDE: 105 mmol/L (ref 101–111)
CO2: 32 mmol/L (ref 22–32)
Calcium: 8.6 mg/dL — ABNORMAL LOW (ref 8.9–10.3)
Creatinine, Ser: 0.74 mg/dL (ref 0.44–1.00)
GFR calc Af Amer: >60 mL/min (ref >60)
GFR calc non Af Amer: >60 mL/min (ref >60)
GLUCOSE: 117 mg/dL — AB (ref 65–99)
POTASSIUM: 3.8 mmol/L (ref 3.5–5.1)
SODIUM: 142 mmol/L (ref 135–145)
TOTAL PROTEIN: 5.3 g/dL — AB (ref 6.5–8.1)
Total Bilirubin: 1.1 mg/dL (ref 0.3–1.2)

## 2016-06-14 LAB — MAGNESIUM: Magnesium: 1.9 mg/dL (ref 1.7–2.4)

## 2016-06-14 MED ORDER — ALUM & MAG HYDROXIDE-SIMETH 200-200-20 MG/5ML PO SUSP: 15.0000 mL | Freq: Four times a day (QID) | ORAL | Status: DC | PRN

## 2016-06-14 MED ORDER — ONDANSETRON 4 MG PO TBDP: 4.0000 mg | ORAL_TABLET | Freq: Four times a day (QID) | ORAL | 0 refills | Status: AC | PRN

## 2016-06-14 NOTE — Progress Notes (Signed)
Nutrition Follow-up  DOCUMENTATION CODES:   Severe malnutrition in context of acute illness/injury, Obesity unspecified  INTERVENTION:  Monitor magnesium, potassium, and phosphorus daily for at least 3 days, MD to replete as needed, as pt is at risk for refeeding syndrome given N/V since surgery on 10/2, pt not meeting nutrition requirements post surgery (2-3 weeks) d/t persistent N/V, and Low Mg and K levels.  -Encourage appropriate choices on Bariatric full liquid diet -Reviewed foods to avoid to reduce reflux -Encourage PO intake -RD will continue to monitor for tolerance   NUTRITION DIAGNOSIS:   Inadequate oral intake related to vomiting, nausea as evidenced by meal completion < 25%, per patient/family report.  Improving.  GOAL:   Patient will meet greater than or equal to 90% of their needs  Progressing.  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 Pt seen in clinic late last week. Pt was taking in adequate liquids at that time but having some epigastric discomfort but more right sided discomfort along with the typical indigestion and mild reflux.  She was not necessarily tender at the sleeve gastrectomy extraction site in her right abdominal wall.  Therefore I recommended that she stay on liquids and not advance to solids and we made plans to get an outpatient right upper quadrant ultrasound to evaluate her gallbladder.   Spoke with Bariatric RN regarding pt's progress. Pt is tolerating food and most likely will not need TPN. Pt c/o reflux, and was recommended a different medication. Pt with OJ at bedside (which RD saw during visit) and reports eating potato soup for breakfast. Both of these foods are inappropriate for a Bariatric full liquid diet.  RD spoke with pt and reviewed appropriate foods within her diet. Pt reports  just having urinated. She still has not had a BM (x5 days) but she has received her first dose of Miralax.   Noted weight from 10/25 was obtained. Pt with 13 lb weight loss (5% wt loss x 3 weeks, significant for time frame). Energy intake <50% of recommendations post-op x 3 weeks. Pt now meets criteria for severe malnutrition in setting of acute illness.   Medications: Miralax packet daily, D5 and .45% NaCl w/ KCl infusion at 125 ml/hr- provides 510 kcal, IV Zofran PRN Labs reviewed: Mg/K WNL  Diet Order:  Diet bariatric full liquid Room service appropriate? Yes; Fluid consistency: Thin  Skin:  Wound (see comment) (abdominal incision from 10/2)  Last BM:  10/22  Height:   Ht Readings from Last 1 Encounters:  06/13/16 5\' 4"  (1.626 m)    Weight:   Wt Readings from Last 1 Encounters:  06/13/16 229 lb 8 oz (104.1 kg)    Ideal Body Weight:  54.5 kg  BMI:  Body mass index is 39.39 kg/m.  Estimated Nutritional Needs:   Kcal:  1650-1850  Protein:  65-75g  Fluid:  1.7-1.9L/day  EDUCATION NEEDS:   No education needs identified at this time  Clayton Bibles, MS, RD, LDN Pager: [phone] After Hours Pager: [phone]

## 2016-06-14 NOTE — Progress Notes (Signed)
Patient ID: Tracie Johnson, female   DOB: 03/06/1981, 35 y.o.   MRN: [id number]  Patient feeling better and tolerating PO intake. She is requesting to go home. Will arrange follow-up with Dr. Redmond Pulling in 1-2 weeks, and blood work prior to that appointment.  Tiena Manansala A MILLER

## 2016-06-14 NOTE — Progress Notes (Signed)
Patient ID: Tracie Johnson, female   DOB: 1981-06-21, 35 y.o.   MRN: [id number]  [facility] Surgery Progress Note     Subjective: Persistent RUQ and nausea, although patient reports pain is less than yesterday. No emesis. Has not tried to eat anything since admission. Last BM was Saturday.  Objective: Vital signs in last 24 hours: Temp:  [97.4 F (36.3 C)-98.3 F (36.8 C)] 98.3 F (36.8 C) (10/26 0538) Pulse Rate:  [58-59] 59 (10/26 0538) Resp:  [14] 14 (10/26 0538) BP: (84-90)/(48-53) 90/48 (10/26 0538) SpO2:  [100 %] 100 % (10/26 0538) Weight:  [229 lb 8 oz (104.1 kg)] 229 lb 8 oz (104.1 kg) (10/25 1600) Last BM Date: 06/10/16  Intake/Output from previous day: 10/25 0701 - 10/26 0700 In: 3346.3 [P.O.:240; I.V.:3006.3; IV Piggyback:100] Out: 1250 [Urine:1250] Intake/Output this shift: No intake/output data recorded.  PE: Gen:  Alert, NAD, pleasant Pulm:  Effort normal Abd: Soft, Nd, incisions cdi, TTP RUQ  Lab Results:   Recent Labs  06/12/16 0745 06/13/16 0446  WBC 7.1 5.0  HGB 14.5 13.1  HCT 41.5 37.2  PLT 218 187   BMET  Recent Labs  06/13/16 0446 06/14/16 0433  NA 139 142  K 3.1* 3.8  CL 102 105  CO2 25 32  GLUCOSE 145* 117*  BUN <5* <5*  CREATININE 0.72 0.74  CALCIUM 8.6* 8.6*   PT/INR No results for input(s): LABPROT, INR in the last 72 hours. CMP     Component Value Date/Time   NA 142 06/14/2016 0433   K 3.8 06/14/2016 0433   CL 105 06/14/2016 0433   CO2 32 06/14/2016 0433   GLUCOSE 117 (H) 06/14/2016 0433   BUN <5 (L) 06/14/2016 0433   CREATININE 0.74 06/14/2016 0433   CALCIUM 8.6 (L) 06/14/2016 0433   PROT 5.3 (L) 06/14/2016 0433   ALBUMIN 2.7 (L) 06/14/2016 0433   AST 214 (H) 06/14/2016 0433   ALT 136 (H) 06/14/2016 0433   ALKPHOS 68 06/14/2016 0433   BILITOT 1.1 06/14/2016 0433   GFRNONAA >60 06/14/2016 0433   GFRAA >60 06/14/2016 0433   Lipase     Component Value Date/Time   LIPASE 17 06/12/2016 0745        Studies/Results: Ct Abdomen Pelvis W Contrast  Result Date: 06/12/2016 CLINICAL DATA:  Vomiting. EXAM: CT ABDOMEN AND PELVIS WITH CONTRAST TECHNIQUE: Multidetector CT imaging of the abdomen and pelvis was performed using the standard protocol following bolus administration of intravenous contrast. CONTRAST:  123mL ISOVUE-300 IOPAMIDOL (ISOVUE-300) INJECTION 61% COMPARISON:  None. FINDINGS: Lower chest: Visualized lung bases are unremarkable. Hepatobiliary: No gallstones are noted.  Liver appears normal. Pancreas: Normal. Spleen: Normal. Adrenals/Urinary Tract: Adrenal glands and kidneys appear normal. No hydronephrosis or renal obstruction is noted. No renal or ureteral calculi are noted. Urinary bladder appears normal. Stomach/Bowel: Status post gastric surgery. There is no evidence of bowel obstruction. The appendix appears normal. Vascular/Lymphatic: No significant adenopathy is noted. Reproductive: Uterus and ovaries are unremarkable. Other: Small amount of free fluid is noted in the adnexal regions bilaterally which most likely is physiologic. Musculoskeletal: Status post surgical posterior fusion of the L4 through S1. IMPRESSION: Status post gastric surgery. Postsurgical changes seen in lumbar spine. No acute abnormality seen in the abdomen or pelvis. Electronically Signed   By: Marijo Conception, M.D.   On: 06/12/2016 10:27   Nm Hepato W/eject Fract  Result Date: 06/13/2016 CLINICAL DATA:  Right upper quadrant abdominal pain and vomiting. History of gastric sleeve  surgery 05/21/2016 EXAM: NUCLEAR MEDICINE HEPATOBILIARY IMAGING WITH GALLBLADDER EF TECHNIQUE: Sequential images of the abdomen were obtained out to 60 minutes following intravenous administration of radiopharmaceutical. After slow intravenous infusion of 2.03 micrograms Cholecystokinin, gallbladder ejection fraction was determined. RADIOPHARMACEUTICALS:  5.42 mCi Tc-63m Choletec IV COMPARISON:  CT scan 06/12/2016 FINDINGS:  Symmetric uptake of the radiopharmaceutical in the liver and prompt excretion into the biliary tree which is visualized at 10 minutes. The gallbladder is also visualized at 10 minutes. Activity is seen in the small bowel at 25 minutes. Calculated gallbladder ejection fraction is 50%. (At 60 min, normal ejection fraction is greater than 40%.) IMPRESSION: Normal biliary patency study and gallbladder ejection fraction. Electronically Signed   By: Marijo Sanes M.D.   On: 06/13/2016 16:57    Anti-infectives: Anti-infectives    None       Assessment/Plan RUQ pain, Vomiting - S/p laparoscopic sleeve gastrectomy 10/2 - CT negative - less pain today Elevated LFT's - AST/ALT 214/136, bili normal - HIDA negative Dehydration Hypomagnesemia - replenished, 1.9 today  FEN - bariatric full liquid VTE - lovenox  Plan - persistently elevated transaminases, bili normal. Continued RUQ pain although some improvement today. Feels like she may be able to eat something today so we will wait on PICC/TPN. Ordering hepatis panel. Add miralax.    LOS: 1 day    Jerrye Beavers , [facility] Surgery 06/14/2016, 7:15 AM Pager: [phone] Consults: [phone] Mon-Fri 7:00 am-4:30 pm Sat-Sun 7:00 am-11:30 am

## 2016-06-15 LAB — HEPATITIS PANEL, ACUTE
HCV Ab: <0.1 s/co ratio (ref 0.0–0.9)
HEP B C IGM: NEGATIVE
Hep A IgM: NEGATIVE
Hepatitis B Surface Ag: NEGATIVE

## 2016-06-15 NOTE — Discharge Summary (Signed)
[facility] Discharge Summary   Patient ID: Tracie Johnson MRN: [id number] DOB/AGE: Mar 07, 1981 35 y.o.  Admit date: 06/12/2016 Discharge date: 06/14/2016  Admitting Diagnosis: Intractable vomiting RUQ pain  Discharge Diagnosis Patient Active Problem List   Diagnosis Date Noted  . Protein-calorie malnutrition, severe 06/14/2016  . Vomiting 06/12/2016  . Obesity, Class III, BMI 40-49.9 (morbid obesity) ([place]) 05/21/2016  . Lumbosacral pain, chronic 05/21/2016  . History of prediabetes 05/21/2016  . GERD (gastroesophageal reflux disease) 05/21/2016  . PCOS (polycystic ovarian syndrome) 05/21/2016  . 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 S/P laparoscopic sleeve gastrectomy 05/21/2016    Consultants None  Imaging: Nm Hepato W/eject Fract  Result Date: 06/13/2016 CLINICAL DATA:  Right upper quadrant abdominal pain and vomiting. History of gastric sleeve surgery 05/21/2016 EXAM: NUCLEAR MEDICINE HEPATOBILIARY IMAGING WITH GALLBLADDER EF TECHNIQUE: Sequential images of the abdomen were obtained out to 60 minutes following intravenous administration of radiopharmaceutical. After slow intravenous infusion of 2.03 micrograms Cholecystokinin, gallbladder ejection fraction was determined. RADIOPHARMACEUTICALS:  5.42 mCi Tc-52m Choletec IV COMPARISON:  CT scan 06/12/2016 FINDINGS: Symmetric uptake of the radiopharmaceutical in the liver and prompt excretion into the biliary tree which is visualized at 10 minutes. The gallbladder is also visualized at 10 minutes. Activity is seen in the small bowel at 25 minutes. Calculated gallbladder ejection fraction is 50%. (At 60 min, normal ejection fraction is greater than 40%.) IMPRESSION: Normal biliary patency study and gallbladder ejection fraction. Electronically Signed   By: Marijo Sanes M.D.   On: 06/13/2016 16:57    Procedures None this admission  Hospital Course:  Tracie Johnson is a 35yo female who presented  to [facility] 06/12/16 with RUQ abdominal pain, nausea, vomiting, and worsening oral intake. She is s/p laparoscopic sleeve gastrectomy 05/21/16 by Dr. Redmond Pulling.  Workup showed elevated LFT's and dehydration.  Patient was admitted for IV fluids and further workup. CT scan was negative for leak or obstruction. HIDA was negative. Her LFT's remained elevated but her nausea and vomiting resolved and her RUQ abdominal pain improved. Tracie Johnson was tolerating PO intake on 06/15/16 and felt much better. She was discharged with close follow-up with Dr. Redmond Pulling, and will have labs (CBC, CMP) prior to that appointment.  Physical Exam: Gen:  Alert, NAD, pleasant Pulm:  Effort normal Abd: Soft, ND, incisions cdi, TTP RUQ    Medication List    TAKE these medications   albuterol 108 (90 Base) MCG/ACT inhaler Commonly known as:  PROVENTIL HFA;VENTOLIN HFA Inhale 2 puffs into the lungs every 4 (four) hours as needed for shortness of breath or wheezing.   FLUoxetine 20 MG capsule Commonly known as:  PROZAC Take 80 mg by mouth at bedtime.   gabapentin 300 MG capsule Commonly known as:  NEURONTIN Take 300 mg by mouth 5 (five) times daily.   HYDROcodone-acetaminophen 7.5-325 MG tablet Commonly known as:  NORCO Take 1 tablet by mouth every 6 (six) hours as needed for pain.   ondansetron 4 MG disintegrating tablet Commonly known as:  ZOFRAN-ODT Take 4 mg by mouth every 6 (six) hours as needed for nausea/vomiting. What changed:  Another medication with the same name was added. Make sure you understand how and when to take each.   ondansetron 4 MG disintegrating tablet Commonly known as:  ZOFRAN-ODT Take 1 tablet (4 mg total) by mouth every 6 (six) hours as needed for nausea. What changed:  You were already taking a medication with the same name, and this prescription was  added. Make sure you understand how and when to take each.   oxyCODONE 5 MG/5ML solution Commonly known as:  ROXICODONE Take 5-10 mLs (5-10 mg  total) by mouth every 4 (four) hours as needed for moderate pain or severe pain.        Follow-up Information    Gayland Curry, MD .   Specialty:  General Surgery Why:  1-2 weeks with Dr. Cyndi Bender information: [address] [place] [address] [phone]           Signed: Jerrye Beavers, [facility] Surgery 06/15/2016, 11:17 AM Pager: [phone] Consults: [phone] Mon-Fri 7:00 am-4:30 pm Sat-Sun 7:00 am-11:30 am

## 2016-06-18 ENCOUNTER — Other Ambulatory Visit: Payer: BLUE CROSS/BLUE SHIELD

## 2016-06-21 ENCOUNTER — Telehealth: Payer: Self-pay | Admitting: Dietician

## 2016-06-21 NOTE — Telephone Encounter (Signed)
Talked to Tracie Johnson today since she is struggling with getting in foods after surgery. Unable to tolerate protein shakes (Premier) and can taste Genepro (unflavored) when added to foods and drinks. Cannot tolerate sweet flavored drinks. Has had some frequent vomiting and "living off of Zofran". Is able to tolerate MVI if she takes it with Nexium. Drinking about 40-50 oz of fluid per day.  Tracie Johnson is planning to call Dr. Redmond Pulling tomorrow to see if she still needs to be on a liquid diet.  She is craving vegetables and not meeting protein goal. Chewing ice.  My recommendation was that she talk to Dr. Redmond Pulling tomorrow about her status and then email me to discuss her diet.

## 2016-07-05 ENCOUNTER — Encounter: Payer: BLUE CROSS/BLUE SHIELD | Attending: General Surgery | Admitting: Dietician

## 2016-07-05 DIAGNOSIS — Z6841 Body Mass Index (BMI) 40.0 and over, adult: Secondary | ICD-10-CM | POA: Insufficient documentation

## 2016-07-05 NOTE — Patient Instructions (Addendum)
-  Continue to eat protein foods 4x a day  -Eggs, yogurt, beans, deli meat   Surgery date: 05/21/2016 Surgery type: Sleeve Gastrectomy Start weight at [facility]: 239 lbs on 12/08/2015 Weight today: 216.8 lbs Weight change: 7.2 lbs Total weight lost: 22.2 lbs  TANITA  BODY COMP RESULTS  05/09/16 06/06/16 07/05/16   BMI (kg/m^2) 41.3 38.4 37.2   Fat Mass (lbs) 124.4 115 105.6   Fat Free Mass (lbs) 166.4 109 111.2   Total Body Water (lbs) 85.6 79.8 81

## 2016-07-05 NOTE — Progress Notes (Signed)
  Follow-up visit:  6 Weeks Post-Operative Sleeve gastrectomy Surgery  Medical Nutrition Therapy:  Appt start time: 0805 end time:  0815  Primary concerns today: Post-operative Bariatric Surgery Nutrition Management. Tracie Johnson returns today having lost 22 pounds. She reports that she is not tolerating protein shakes but tolerating eggs, potatoes, vegetable soup, chili beans, yogurt, and deli meat. No longer having any vomiting. Taking supplements as recommended. Tracie Johnson states that she is eating 3-4x a day and drinking 40 oz decaf unsweet tea. She states that she can no longer tolerate milk or cheese.  Patient reports she has to leave the appointment to be at another appointment at 8:30.   Surgery date: 05/21/2016 Surgery type: Sleeve Gastrectomy Start weight at [facility]: 239 lbs on 12/08/2015 Weight today: 216.8 lbs Weight change: 7.2 lbs Total weight lost: 22.2 lbs  TANITA  BODY COMP RESULTS  05/09/16 06/06/16 07/05/16   BMI (kg/m^2) 41.3 38.4 37.2   Fat Mass (lbs) 124.4 115 105.6   Fat Free Mass (lbs) 166.4 109 111.2   Total Body Water (lbs) 85.6 79.8 81    Preferred Learning Style:   No preference indicated   Learning Readiness:   Ready  Fluid intake: 40 oz unsweet decaf tea Estimated total protein intake: unable to assess, patient verbalizes that she is not meeting protein needs  Medications: see list Supplementation: taking  Drinking while eating: did not assess Hair loss: none Carbonated beverages: did not assess N/V/D/C: none  Dumping syndrome: no  Recent physical activity:  Did not assess  Progress Towards Goal(s):  In progress.  Handouts given during visit include:  none   Nutritional Diagnosis:  [place]-3.3 Overweight/obesity related to past poor dietary habits and physical inactivity as evidenced by patient w/ recent sleeve gastrectomy surgery following dietary guidelines for continued weight loss.     Intervention:  Nutrition education/counseling provided. Encouraged  the patient to continue to eat quality, well-toleratedprotein foods 4x a day to meet protein needs.  Teaching Method Utilized:  Visual Auditory Hands on  Barriers to learning/adherence to lifestyle change: none  Demonstrated degree of understanding via:  Teach Back   Monitoring/Evaluation:  Dietary intake, exercise, and body weight. Follow up in 1 months for 2.5 month post-op visit.

## 2016-07-06 ENCOUNTER — Encounter: Payer: Self-pay | Admitting: Dietician

## 2016-07-31 ENCOUNTER — Other Ambulatory Visit (HOSPITAL_COMMUNITY): Payer: Self-pay | Admitting: General Surgery

## 2016-07-31 ENCOUNTER — Other Ambulatory Visit: Payer: Self-pay | Admitting: General Surgery

## 2016-07-31 DIAGNOSIS — Z9884 Bariatric surgery status: Secondary | ICD-10-CM

## 2016-08-02 ENCOUNTER — Ambulatory Visit (HOSPITAL_COMMUNITY)
Admission: RE | Admit: 2016-08-02 | Discharge: 2016-08-02 | Disposition: A | Discharge status: Home / Self Care | Payer: BLUE CROSS/BLUE SHIELD | Source: Ambulatory Visit | Attending: General Surgery | Admitting: General Surgery

## 2016-08-02 ENCOUNTER — Ambulatory Visit (HOSPITAL_COMMUNITY): Payer: BLUE CROSS/BLUE SHIELD

## 2016-08-02 DIAGNOSIS — E860 Dehydration: Secondary | ICD-10-CM | POA: Diagnosis present

## 2016-08-02 DIAGNOSIS — R112 Nausea with vomiting, unspecified: Secondary | ICD-10-CM

## 2016-08-02 DIAGNOSIS — Z9884 Bariatric surgery status: Secondary | ICD-10-CM | POA: Insufficient documentation

## 2016-08-02 LAB — CBC WITH DIFFERENTIAL/PLATELET
Basophils Absolute: 0 10*3/uL (ref 0.0–0.1)
Basophils Relative: 0 %
EOS ABS: 0.1 10*3/uL (ref 0.0–0.7)
EOS PCT: 1 %
HCT: 37.5 % (ref 36.0–46.0)
Hemoglobin: 13.6 g/dL (ref 12.0–15.0)
LYMPHS ABS: 1.4 10*3/uL (ref 0.7–4.0)
LYMPHS PCT: 24 %
MCH: 30.2 pg (ref 26.0–34.0)
MCHC: 36.3 g/dL — AB (ref 30.0–36.0)
MCV: 83.1 fL (ref 78.0–100.0)
MONOS PCT: 7 %
Monocytes Absolute: 0.4 10*3/uL (ref 0.1–1.0)
Neutro Abs: 3.9 10*3/uL (ref 1.7–7.7)
Neutrophils Relative %: 68 %
Platelets: 197 10*3/uL (ref 150–400)
RBC: 4.51 MIL/uL (ref 3.87–5.11)
RDW: 14.1 % (ref 11.5–15.5)
WBC: 5.7 10*3/uL (ref 4.0–10.5)

## 2016-08-02 LAB — MAGNESIUM: MAGNESIUM: 1.7 mg/dL (ref 1.7–2.4)

## 2016-08-02 LAB — COMPREHENSIVE METABOLIC PANEL
ALK PHOS: 74 U/L (ref 38–126)
ALT: 67 U/L — ABNORMAL HIGH (ref 14–54)
ANION GAP: 10 (ref 5–15)
AST: 100 U/L — ABNORMAL HIGH (ref 15–41)
Albumin: 3.1 g/dL — ABNORMAL LOW (ref 3.5–5.0)
BUN: 6 mg/dL (ref 6–20)
CALCIUM: 8.1 mg/dL — AB (ref 8.9–10.3)
CO2: 25 mmol/L (ref 22–32)
CREATININE: 0.65 mg/dL (ref 0.44–1.00)
Chloride: 105 mmol/L (ref 101–111)
Glucose, Bld: 94 mg/dL (ref 65–99)
Potassium: 3.3 mmol/L — ABNORMAL LOW (ref 3.5–5.1)
SODIUM: 140 mmol/L (ref 135–145)
TOTAL PROTEIN: 6.1 g/dL — AB (ref 6.5–8.1)
Total Bilirubin: 1.4 mg/dL — ABNORMAL HIGH (ref 0.3–1.2)

## 2016-08-02 LAB — PHOSPHORUS: PHOSPHORUS: 2.2 mg/dL — AB (ref 2.5–4.6)

## 2016-08-02 LAB — PREALBUMIN: PREALBUMIN: 6.2 mg/dL — AB (ref 18–38)

## 2016-08-02 MED ORDER — ACETAMINOPHEN 325 MG PO TABS: 325.0000 mg | ORAL_TABLET | ORAL | Status: DC | PRN

## 2016-08-02 MED ORDER — ACETAMINOPHEN 325 MG RE SUPP
325.0000 mg | RECTAL | Status: DC | PRN
  Filled 2016-08-02: qty 2

## 2016-08-02 MED ORDER — SODIUM CHLORIDE 0.9 % IV SOLN
INTRAVENOUS | Status: DC
  Administered 2016-08-02: 14:00:00 via INTRAVENOUS

## 2016-08-02 MED ORDER — ONDANSETRON 4 MG PO TBDP
4.0000 mg | ORAL_TABLET | ORAL | Status: DC | PRN
  Filled 2016-08-02: qty 1

## 2016-08-02 MED ORDER — PANTOPRAZOLE SODIUM 40 MG PO PACK
40.0000 mg | PACK | Freq: Every day | ORAL | Status: DC
  Administered 2016-08-02: 40 mg
  Filled 2016-08-02: qty 20

## 2016-08-02 MED ORDER — ACETAMINOPHEN 160 MG/5ML PO SOLN
325.0000 mg | ORAL | Status: DC | PRN
  Filled 2016-08-02: qty 20.3

## 2016-08-02 MED ORDER — ONDANSETRON HCL 4 MG/2ML IJ SOLN
4.0000 mg | INTRAMUSCULAR | Status: DC | PRN
  Administered 2016-08-02: 4 mg via INTRAVENOUS
  Filled 2016-08-02: qty 2

## 2016-08-02 MED ORDER — THIAMINE HCL 100 MG/ML IJ SOLN
Freq: Once | INTRAVENOUS | Status: AC
  Administered 2016-08-02: 14:00:00 via INTRAVENOUS
  Filled 2016-08-02: qty 1000

## 2016-08-02 NOTE — Progress Notes (Signed)
Patient arrived to [facility] for transfusion of sodium chloride 0.9 % 1,000 mL with thiamine 123XX123 mg, folic acid 1 mg, multivitamins adult 10 mL infusion and 1 L of NS.  Vital signs remained stable through out the procedure. IV access was established and discontinued. Labs drawn. Pt is ambulatory. Pt voided. Mother present at bedside. Discharge education and  instructions provided. Patient discharged to home.  Lucien Mons, [facility]

## 2016-08-03 ENCOUNTER — Other Ambulatory Visit: Payer: Self-pay | Admitting: General Surgery

## 2016-08-06 ENCOUNTER — Encounter (HOSPITAL_COMMUNITY): Payer: Self-pay | Admitting: Nurse Practitioner

## 2016-08-06 ENCOUNTER — Ambulatory Visit (HOSPITAL_COMMUNITY)
Admission: RE | Admit: 2016-08-06 | Discharge: 2016-08-06 | Disposition: A | Discharge status: Home / Self Care | Payer: BLUE CROSS/BLUE SHIELD | Source: Ambulatory Visit | Attending: General Surgery | Admitting: General Surgery

## 2016-08-06 ENCOUNTER — Other Ambulatory Visit: Payer: Self-pay | Admitting: General Surgery

## 2016-08-06 ENCOUNTER — Inpatient Hospital Stay (HOSPITAL_COMMUNITY)
Admission: AD | Admit: 2016-08-06 | Discharge: 2016-08-08 | DRG: 641 | Disposition: A | Discharge status: Home / Self Care | Payer: BLUE CROSS/BLUE SHIELD | Source: Ambulatory Visit | Attending: General Surgery | Admitting: General Surgery

## 2016-08-06 DIAGNOSIS — Z6838 Body mass index (BMI) 38.0-38.9, adult: Secondary | ICD-10-CM | POA: Diagnosis not present

## 2016-08-06 DIAGNOSIS — J45909 Unspecified asthma, uncomplicated: Secondary | ICD-10-CM | POA: Diagnosis present

## 2016-08-06 DIAGNOSIS — E669 Obesity, unspecified: Secondary | ICD-10-CM | POA: Diagnosis present

## 2016-08-06 DIAGNOSIS — E876 Hypokalemia: Secondary | ICD-10-CM | POA: Diagnosis present

## 2016-08-06 DIAGNOSIS — Z9884 Bariatric surgery status: Secondary | ICD-10-CM

## 2016-08-06 DIAGNOSIS — Z79891 Long term (current) use of opiate analgesic: Secondary | ICD-10-CM | POA: Diagnosis not present

## 2016-08-06 DIAGNOSIS — K210 Gastro-esophageal reflux disease with esophagitis: Secondary | ICD-10-CM | POA: Diagnosis present

## 2016-08-06 DIAGNOSIS — K224 Dyskinesia of esophagus: Secondary | ICD-10-CM | POA: Diagnosis present

## 2016-08-06 DIAGNOSIS — M533 Sacrococcygeal disorders, not elsewhere classified: Secondary | ICD-10-CM | POA: Diagnosis present

## 2016-08-06 DIAGNOSIS — Z79899 Other long term (current) drug therapy: Secondary | ICD-10-CM

## 2016-08-06 DIAGNOSIS — E860 Dehydration: Secondary | ICD-10-CM

## 2016-08-06 DIAGNOSIS — E46 Unspecified protein-calorie malnutrition: Principal | ICD-10-CM | POA: Diagnosis present

## 2016-08-06 LAB — CBC
HCT: 35.3 % — ABNORMAL LOW (ref 36.0–46.0)
HEMOGLOBIN: 12.5 g/dL (ref 12.0–15.0)
MCH: 29.5 pg (ref 26.0–34.0)
MCHC: 35.4 g/dL (ref 30.0–36.0)
MCV: 83.3 fL (ref 78.0–100.0)
PLATELETS: 172 10*3/uL (ref 150–400)
RBC: 4.24 MIL/uL (ref 3.87–5.11)
RDW: 14.2 % (ref 11.5–15.5)
WBC: 4.3 10*3/uL (ref 4.0–10.5)

## 2016-08-06 LAB — MAGNESIUM: Magnesium: 1.6 mg/dL — ABNORMAL LOW (ref 1.7–2.4)

## 2016-08-06 LAB — COMPREHENSIVE METABOLIC PANEL
ALBUMIN: 2.6 g/dL — AB (ref 3.5–5.0)
ALK PHOS: 64 U/L (ref 38–126)
ALT: 53 U/L (ref 14–54)
AST: 88 U/L — AB (ref 15–41)
Anion gap: 11 (ref 5–15)
BUN: <5 mg/dL — ABNORMAL LOW (ref 6–20)
CALCIUM: 8.1 mg/dL — AB (ref 8.9–10.3)
CHLORIDE: 106 mmol/L (ref 101–111)
CO2: 26 mmol/L (ref 22–32)
CREATININE: 0.66 mg/dL (ref 0.44–1.00)
GFR calc Af Amer: >60 mL/min (ref >60)
GFR calc non Af Amer: >60 mL/min (ref >60)
GLUCOSE: 65 mg/dL (ref 65–99)
Potassium: 3.4 mmol/L — ABNORMAL LOW (ref 3.5–5.1)
SODIUM: 143 mmol/L (ref 135–145)
Total Bilirubin: 1.2 mg/dL (ref 0.3–1.2)
Total Protein: 5.2 g/dL — ABNORMAL LOW (ref 6.5–8.1)

## 2016-08-06 LAB — PREALBUMIN: Prealbumin: 5.8 mg/dL — ABNORMAL LOW (ref 18–38)

## 2016-08-06 LAB — PHOSPHORUS: PHOSPHORUS: 2.9 mg/dL (ref 2.5–4.6)

## 2016-08-06 MED ORDER — FENTANYL CITRATE (PF) 100 MCG/2ML IJ SOLN: 12.5000 ug | INTRAMUSCULAR | Status: DC | PRN

## 2016-08-06 MED ORDER — ADULT MULTIVITAMIN LIQUID CH
15.0000 mL | Freq: Every day | ORAL | Status: DC
  Filled 2016-08-06 (×2): qty 15

## 2016-08-06 MED ORDER — DIPHENHYDRAMINE HCL 12.5 MG/5ML PO ELIX: 12.5000 mg | ORAL_SOLUTION | Freq: Four times a day (QID) | ORAL | Status: DC | PRN

## 2016-08-06 MED ORDER — M.V.I. ADULT IV INJ
INJECTION | INTRAVENOUS | Status: DC
Start: 2016-08-06 — End: 2016-08-07
  Administered 2016-08-06: 09:00:00 via INTRAVENOUS
  Filled 2016-08-06 (×4): qty 1000

## 2016-08-06 MED ORDER — THIAMINE HCL 100 MG/ML IJ SOLN
100.0000 mg | Freq: Every day | INTRAMUSCULAR | Status: DC
  Administered 2016-08-07 – 2016-08-08 (×2): 100 mg via INTRAVENOUS
  Filled 2016-08-06 (×2): qty 2

## 2016-08-06 MED ORDER — ACETAMINOPHEN 160 MG/5ML PO SOLN
325.0000 mg | ORAL | Status: DC | PRN
  Filled 2016-08-06: qty 20.3

## 2016-08-06 MED ORDER — PREMIER PROTEIN SHAKE
2.0000 [oz_av] | Freq: Four times a day (QID) | ORAL | Status: DC
  Administered 2016-08-07: 2 [oz_av] via ORAL

## 2016-08-06 MED ORDER — PANTOPRAZOLE SODIUM 40 MG IV SOLR
40.0000 mg | Freq: Two times a day (BID) | INTRAVENOUS | Status: DC
  Administered 2016-08-06 – 2016-08-08 (×5): 40 mg via INTRAVENOUS
  Filled 2016-08-06 (×5): qty 40

## 2016-08-06 MED ORDER — ACETAMINOPHEN 325 MG RE SUPP
325.0000 mg | RECTAL | Status: DC | PRN
  Filled 2016-08-06: qty 2

## 2016-08-06 MED ORDER — ONDANSETRON 4 MG PO TBDP
4.0000 mg | ORAL_TABLET | ORAL | Status: DC | PRN
  Administered 2016-08-06: 4 mg via ORAL
  Filled 2016-08-06 (×2): qty 1

## 2016-08-06 MED ORDER — ONDANSETRON 4 MG PO TBDP
4.0000 mg | ORAL_TABLET | Freq: Four times a day (QID) | ORAL | Status: DC | PRN
  Administered 2016-08-08 (×2): 4 mg via ORAL
  Filled 2016-08-06 (×2): qty 1

## 2016-08-06 MED ORDER — ONDANSETRON HCL 4 MG/2ML IJ SOLN
4.0000 mg | Freq: Four times a day (QID) | INTRAMUSCULAR | Status: DC | PRN
  Administered 2016-08-06 – 2016-08-07 (×2): 4 mg via INTRAVENOUS
  Filled 2016-08-06 (×2): qty 2

## 2016-08-06 MED ORDER — FLUOXETINE HCL 20 MG PO CAPS
60.0000 mg | ORAL_CAPSULE | Freq: Every day | ORAL | Status: DC
  Administered 2016-08-06: 60 mg via ORAL
  Filled 2016-08-06: qty 3

## 2016-08-06 MED ORDER — MAGNESIUM SULFATE 2 GM/50ML IV SOLN
2.0000 g | Freq: Once | INTRAVENOUS | Status: AC
  Administered 2016-08-06: 2 g via INTRAVENOUS
  Filled 2016-08-06: qty 50

## 2016-08-06 MED ORDER — POTASSIUM CHLORIDE IN NACL 20-0.9 MEQ/L-% IV SOLN
INTRAVENOUS | Status: DC
  Administered 2016-08-06: 09:00:00 via INTRAVENOUS
  Filled 2016-08-06 (×2): qty 1000

## 2016-08-06 MED ORDER — SUCRALFATE 1 GM/10ML PO SUSP
1.0000 g | Freq: Four times a day (QID) | ORAL | Status: DC
  Administered 2016-08-06: 1 g via ORAL
  Filled 2016-08-06: qty 10

## 2016-08-06 MED ORDER — DIPHENHYDRAMINE HCL 50 MG/ML IJ SOLN
12.5000 mg | Freq: Four times a day (QID) | INTRAMUSCULAR | Status: DC | PRN
  Filled 2016-08-06: qty 1

## 2016-08-06 MED ORDER — GABAPENTIN 300 MG PO CAPS
600.0000 mg | ORAL_CAPSULE | Freq: Every day | ORAL | Status: DC
  Administered 2016-08-06 – 2016-08-07 (×2): 600 mg via ORAL
  Filled 2016-08-06 (×2): qty 2

## 2016-08-06 MED ORDER — ACETAMINOPHEN 325 MG PO TABS: 325.0000 mg | ORAL_TABLET | ORAL | Status: DC | PRN

## 2016-08-06 MED ORDER — ONDANSETRON HCL 4 MG/2ML IJ SOLN: 4.0000 mg | INTRAMUSCULAR | Status: DC | PRN

## 2016-08-06 MED ORDER — KCL IN DEXTROSE-NACL 20-5-0.45 MEQ/L-%-% IV SOLN
INTRAVENOUS | Status: DC
  Administered 2016-08-06 – 2016-08-07 (×2): via INTRAVENOUS
  Filled 2016-08-06 (×2): qty 1000

## 2016-08-06 MED ORDER — ENOXAPARIN SODIUM 40 MG/0.4ML ~~LOC~~ SOLN
40.0000 mg | SUBCUTANEOUS | Status: DC
  Administered 2016-08-06 – 2016-08-07 (×2): 40 mg via SUBCUTANEOUS
  Filled 2016-08-06 (×2): qty 0.4

## 2016-08-06 MED ORDER — INFLUENZA VAC SPLIT QUAD 0.5 ML IM SUSY
0.5000 mL | PREFILLED_SYRINGE | INTRAMUSCULAR | Status: DC
  Filled 2016-08-06: qty 0.5

## 2016-08-06 MED ORDER — FLUOXETINE HCL 20 MG PO CAPS: 60.0000 mg | ORAL_CAPSULE | Freq: Every day | ORAL | Status: DC

## 2016-08-06 NOTE — Progress Notes (Signed)
Pt received to the [facility] for hydration. Pt's IV was placed after 2 attempts. Pt became nauseated and  Zofran was given.  Dr. Redmond Pulling came over to see patient and orders written to admit patient to the general surgery floor.

## 2016-08-06 NOTE — Anesthesia Preprocedure Evaluation (Signed)
Anesthesia Evaluation  Patient identified by MRN, date of birth, ID band Patient awake    Reviewed: Allergy & Precautions, H&P , Patient's Chart, lab work & pertinent test results, reviewed documented beta blocker date and time   Airway Mallampati: II  TM Distance: >3 FB Neck ROM: full    Dental no notable dental hx.    Pulmonary asthma , Current Smoker,    Pulmonary exam normal breath sounds clear to auscultation       Cardiovascular  Rhythm:regular Rate:Normal     Neuro/Psych    GI/Hepatic Medicated,  Endo/Other    Renal/GU      Musculoskeletal   Abdominal   Peds  Hematology   Anesthesia Other Findings Asthma Easy Mask Ventilation MO  Reproductive/Obstetrics                             Anesthesia Physical Anesthesia Plan  ASA: III  Anesthesia Plan: MAC   Post-op Pain Management:    Induction: Intravenous  Airway Management Planned: Mask and Natural Airway  Additional Equipment:   Intra-op Plan:   Post-operative Plan:   Informed Consent: I have reviewed the patients History and Physical, chart, labs and discussed the procedure including the risks, benefits and alternatives for the proposed anesthesia with the patient or authorized representative who has indicated his/her understanding and acceptance.   Dental Advisory Given  Plan Discussed with: CRNA and Surgeon  Anesthesia Plan Comments: (Discussed sedation and potential to need to place airway or ETT if warranted by clinical changes intra-operatively. We will start procedure as MAC.)        Anesthesia Quick Evaluation

## 2016-08-06 NOTE — H&P (Signed)
Tracie Johnson is an 35 y.o. female.   Chief Complaint: heartburn HPI: 35 year old female status post laparoscopic sleeve gastrectomy October 2 was brought in for ongoing difficulties with oral intake. She was readmitted from October 24 through the 26 with difficulties with oral intake, right upper quadrant pain. CT imaging at that time was unremarkable. Nuclear medicine scan showed no evidence of gallbladder infection. Oral intake improved and she was discharged home. I saw her in the clinic a week or 2 later and her oral intake was consistently improving. She contacted [place] about a week and a half ago stating that her liquid intake was going down, she was having some nausea but no fever or abdominal pain. She was also having some heartburn. She was arranged to have outpatient IV fluids on several occasions and continued on Protonix and Zofran. Late last week nutrition labs indicated a prealbumin level of around 6. Last week when she came down for additional fluids we also obtained an upper GI at that time which showed no leak or obstruction however she did have evidence of reflux with intermittent boluses of liquid intake. She also reported sensation of immediate nausea with placement of food or liquids in her oropharynx with some retching. We continued her on scheduled PPI and scheduled Zofran. She was scheduled for return to the infusion clinic today for more fluids. I saw her today in the infusion center and she reported ongoing difficulty with liquid intake but she overall felt better. She denies any fevers, chills, abdominal pain. She denies any regurgitation. She did fill what she described as swelling in her upper chest or lower neck  Past Medical History:  Diagnosis Date  . Asthma    allergy induced asthma   . Cancer (HCC)    hx of cervical cancer   . GERD (gastroesophageal reflux disease)   . PONV (postoperative nausea and vomiting)     Past Surgical History:  Procedure Laterality Date  . BACK  SURGERY     x 2   . BREAST ENHANCEMENT SURGERY    . LAPAROSCOPIC GASTRIC SLEEVE RESECTION N/A 05/21/2016   Procedure: LAPAROSCOPIC GASTRIC SLEEVE RESECTION, UPPER ENDOSCOPY;  Surgeon: [name], MD;  Location: [facility];  Service: General;  Laterality: N/A;  . LEEP surgery     . TUBAL LIGATION    . tummy tuck surgery    . uterine ablation       No family history on file. Social History:  reports that she has quit smoking. She has never used smokeless tobacco. She reports that she does not drink alcohol or use drugs.  Allergies: No Known Allergies   (Not in a hospital admission)  No results found for this or any previous visit (from the past 48 hour(s)). No results found.  Review of Systems  Constitutional: Negative for weight loss.  HENT: Negative for nosebleeds.   Eyes: Negative for blurred vision.  Respiratory: Negative for shortness of breath.   Cardiovascular: Negative for chest pain, palpitations, orthopnea and PND.       Denies DOE  Gastrointestinal: Positive for heartburn, nausea and vomiting. Negative for diarrhea.  Genitourinary: Negative for dysuria and hematuria.  Musculoskeletal: Negative.   Skin: Negative for itching and rash.  Neurological: Negative for dizziness, focal weakness, seizures, loss of consciousness and headaches.       Denies TIAs, amaurosis fugax  Endo/Heme/Allergies: Does not bruise/bleed easily.  Psychiatric/Behavioral: The patient is not nervous/anxious.    Vital signs at the infusion clinic were within  normal limits  There were no vitals taken for this visit. Physical Exam  Vitals reviewed. Constitutional: She is oriented to person, place, and time. She appears well-developed and well-nourished. No distress.  HENT:  Head: Normocephalic and atraumatic.  Right Ear: External ear normal.  Left Ear: External ear normal.  Eyes: Conjunctivae are normal. No scleral icterus.  Neck: Normal range of motion. Neck supple. No tracheal deviation  present. No thyromegaly present.  Cardiovascular: Normal rate and normal heart sounds.   Respiratory: Effort normal and breath sounds normal. No stridor. No respiratory distress. She has no wheezes.  GI: Soft. She exhibits no distension. There is no tenderness. There is no rebound.  Musculoskeletal: She exhibits no edema or tenderness.  Lymphadenopathy:    She has no cervical adenopathy.  Neurological: She is alert and oriented to person, place, and time. She exhibits normal muscle tone.  Skin: Skin is warm and dry. No rash noted. She is not diaphoretic. No erythema. No pallor.  Psychiatric: She has a normal mood and affect. Her behavior is normal. Judgment and thought content normal.     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 I advised her we need to get this better controlled before going into the holidays. At this point she also needs an upper endoscopy for direct visualization of her distal esophagus and sleeve to see if there is any mucosal changes as well as potential dilation. I will consult gastroenterology to help me with this. We will go and place a PICC line so she can get intermittent fluids at home along with potential TPN. Also if her sleeve appears greatly abnormal and we may need to proceed with placement of a jejunostomy tube in the operating room later this week  I discussed the placement of a potential jejunostomy tube with the patient's father. I have not broached the subject of a potential temporary feeding tube with the patient just yet.  Leighton Ruff. Redmond Pulling, MD, [place], Bariatric, & Minimally Invasive Surgery [facility], [place]   Gayland Curry, MD 08/06/2016, 11:51 AM

## 2016-08-06 NOTE — Consult Note (Signed)
Referring Provider:   Dr. 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 female admitted to the hospital today because of a several week history of intensified nausea, food intolerance, and vomiting with dry heaves, necessitating daily trips to the sickle Center for IV fluid infusion therapy to maintain hydration.  The patient underwent a gastric sleeve operation on October 2, without any obvious problems area she had the normal postoperative dietary adjustment and was actually doing well until about a month ago when the above symptoms developed. She also had some right upper quadrant pain. A CT scan and an upper GI series were unrevealing for any anatomic issues, including esophageal stricturing, although the barium tablet administered at the end of the upper GI series did remain in the esophagus for several minutes, finally clearing with additional sips of fluid.  At baseline, prior to her surgery, the patient had fairly frequent, diet related reflux symptomatology including acid regurgitation, which she would treat with over-the-counter antacids and occasional Zantac. She has never been on actual prescription medication for that problem.  Currently, the patient does experience some sense of dysphagia, particularly to pills, which seem to stick in the middle of her esophagus. It is difficult for her to get down even 32 ounces of liquid in a day's time; taking small sips is the only thing that she can tolerate.  Dr. Redmond Pulling has requested endoscopic evaluation to check for mucosal or anatomic abnormalities that might he, for the patient's symptoms.     Past Medical History:  Diagnosis Date  . Asthma    allergy induced asthma   . Cancer (HCC)    hx of cervical cancer   . GERD (gastroesophageal reflux disease)   . PONV  (postoperative nausea and vomiting)     Past Surgical History:  Procedure Laterality Date  . BACK SURGERY     x 2   . BREAST ENHANCEMENT SURGERY    . LAPAROSCOPIC GASTRIC SLEEVE RESECTION N/A 05/21/2016   Procedure: LAPAROSCOPIC GASTRIC SLEEVE RESECTION, UPPER ENDOSCOPY;  Surgeon: [name], MD;  Location: [facility];  Service: General;  Laterality: N/A;  . LEEP surgery     . TUBAL LIGATION    . tummy tuck surgery    . uterine ablation       Prior to Admission medications   Medication Sig Start Date End Date Taking? Authorizing Provider  albuterol (PROVENTIL HFA;VENTOLIN HFA) 108 (90 Base) MCG/ACT inhaler Inhale 2 puffs into the lungs every 4 (four) hours as needed for shortness of breath or wheezing. 07/18/15  Yes Historical Provider, MD  esomeprazole (NEXIUM) 40 MG capsule Take 40 mg by mouth daily at 12 noon.   Yes Historical Provider, MD  FLUoxetine (PROZAC) 20 MG capsule Take 60 mg by mouth at bedtime.  03/06/16  Yes Historical Provider, MD  gabapentin (NEURONTIN) 300 MG capsule Take 600 mg by mouth at bedtime.  03/15/16  Yes Historical Provider, MD  HYDROcodone-acetaminophen (NORCO) 7.5-325 MG tablet Take 1 tablet by mouth every 6 (six) hours as needed for pain. 04/25/16  Yes Historical Provider, MD  ondansetron (ZOFRAN-ODT) 4 MG disintegrating tablet Take 1 tablet (4 mg total) by mouth every 6 (six) hours as needed for nausea. 06/14/16  Yes Jerrye Beavers, PA-C  oxyCODONE (ROXICODONE) 5 MG/5ML solution Take 5-10 mLs (5-10 mg total) by mouth every 4 (four)  hours as needed for moderate pain or severe pain. Patient not taking: Reported on 06/12/2016 05/22/16   [name], MD    Current Facility-Administered Medications  Medication Dose Route Frequency Provider Last Rate Last Dose  . diphenhydrAMINE (BENADRYL) 12.5 MG/5ML elixir 12.5 mg  12.5 mg Oral Q6H PRN [name], MD       Or  . diphenhydrAMINE (BENADRYL) injection 12.5 mg  12.5 mg Intravenous Q6H PRN [name], MD      .  enoxaparin (LOVENOX) injection 40 mg  40 mg Subcutaneous Q24H [name], MD      . Derrill Memo ON 08/07/2016] Influenza vac split quadrivalent PF (FLUARIX) injection 0.5 mL  0.5 mL Intramuscular Tomorrow-1000 [name], MD      . magnesium sulfate IVPB 2 g 50 mL  2 g Intravenous Once [name], MD      . Derrill Memo ON 08/07/2016] multivitamin liquid 15 mL  15 mL Oral Daily [name], MD      . ondansetron (ZOFRAN-ODT) disintegrating tablet 4 mg  4 mg Oral Q6H PRN [name], MD       Or  . ondansetron [facility]) injection 4 mg  4 mg Intravenous Q6H PRN [name], MD      . pantoprazole (PROTONIX) injection 40 mg  40 mg Intravenous Q12H [name], MD      . protein supplement (PREMIER PROTEIN) liquid  2 oz Oral QID [name], MD      . sucralfate (CARAFATE) 1 GM/10ML suspension 1 g  1 g Oral Q6H [name], MD      . Derrill Memo ON 08/07/2016] thiamine (B-1) injection 100 mg  100 mg Intravenous Daily [name], MD       Facility-Administered Medications Ordered in Other Encounters  Medication Dose Route Frequency Provider Last Rate Last Dose  . 0.9 % NaCl with KCl 20 mEq/ L  infusion   Intravenous Continuous [name], MD 125 mL/hr at 08/06/16 [id number]    . acetaminophen (TYLENOL) tablet 325-650 mg  325-650 mg Oral Q4H PRN [name], MD       Or  . acetaminophen (TYLENOL) solution 325-650 mg  325-650 mg Per Tube Q4H PRN [name], MD       Or  . acetaminophen (TYLENOL) suppository 325-650 mg  325-650 mg Rectal Q4H PRN [name], MD      . fentaNYL (SUBLIMAZE) injection 12.5 mcg  12.5 mcg Intravenous Q4H PRN [name], MD      . ondansetron (ZOFRAN-ODT) disintegrating tablet 4 mg  4 mg Oral Q4H PRN [name], MD   4 mg at 08/06/16 [id number]   Or  . ondansetron [facility]) injection 4 mg  4 mg Intravenous Q4H PRN [name], MD      . sodium chloride 0.9 % 1,000 mL with thiamine [id number] mg, folic acid 1 mg, multivitamins adult 10 mL infusion   Intravenous Continuous [name], MD 150 mL/hr at  08/06/16 0926      Allergies as of 08/06/2016  . (No Known Allergies)    No family history on file.  Social History   Social History  . Marital status: Married    Spouse name: Josetta Feig  . Number of children: 1  . Years of education: 43   Occupational History  . unemployed    Social History Main Topics  . Smoking status: Current Some Day Smoker    Types: Cigarettes  . Smokeless tobacco: Never Used     Comment: 2-3 cigarettes/week  .  Alcohol use Yes     Comment: '7 times my whole life'  . Drug use: No  . Sexual activity: Not Currently    Partners: Male    Birth control/ protection: Surgical   Other Topics Concern  . Not on file   Social History Narrative  . No narrative on file    Review of Systems: The patient has lost about 35 pounds since her bariatric operation 10 weeks ago. Upper tract symptoms as per history of present illness. No history of cardiopulmonary problems or sleep apnea. No skin problems, urinary symptoms, or joint effusions. She is on gabapentin because of chronic right lower extremity discomfort which has persisted following 2 back operations.  Physical Exam: Vital signs in last 24 hours: Temp:  [98.4 F (36.9 C)] 98.4 F (36.9 C) (12/18 1404) Pulse Rate:  [61] 61 (12/18 1404) Resp:  [15] 15 (12/18 1404) BP: (94)/(55) 94/55 (12/18 1404) SpO2:  [97 %] 97 % (12/18 1404) Weight:  [98 kg (216 lb)] 98 kg (216 lb) (12/18 1319) Last BM Date: 08/04/16 General:   Alert, moderately overweight pleasant and cooperative in NAD Head:  Normocephalic and atraumatic. Eyes:  Sclera clear, no icterus.   Conjunctiva pale. Neck:   No masses or thyromegaly. Lungs:  Clear throughout to auscultation.   No wheezes, crackles, or rhonchi. No evident respiratory distress. Heart:   Regular rate and rhythm; no murmurs, clicks, rubs,  or gallops. Abdomen:   Adipose, soft, nontender, nontympanitic, and nondistended. No masses, hepatosplenomegaly or ventral hernias noted.  Normal bowel sounds, without bruits, guarding, or rebound.   Msk:   Symmetrical without gross deformities. Extremities:   Without edema. Neurologic:  Alert and coherent;  grossly normal neurologically. Skin:  Intact without significant lesions or rashes. Cervical Nodes:  No significant cervical adenopathy. Psych:   Alert and cooperative. Normal mood and affect.  Intake/Output from previous day: No intake/output data recorded. Intake/Output this shift: No intake/output data recorded.  Lab Results:  Recent Labs  08/06/16 1429  WBC 4.3  HGB 12.5  HCT 35.3*  PLT 172   BMET  Recent Labs  08/06/16 1429  NA 143  K 3.4*  CL 106  CO2 26  GLUCOSE 65  BUN <5*  CREATININE 0.66  CALCIUM 8.1*   LFT  Recent Labs  08/06/16 1429  PROT 5.2*  ALBUMIN 2.6*  AST 88*  ALT 53  ALKPHOS 64  BILITOT 1.2   PT/INR No results for input(s): LABPROT, INR in the last 72 hours.  Studies/Results: No results found.  Impression: 1. Persistent upper tract symptoms (nausea, vomiting, food intolerance) of unclear cause. 2. Dysphagia, apparently due to esophageal dysmotility given the absence of stricture on barium contrast radiography, but with hang-up of a barium tablet for a fairly prolonged period of time. 3. Now 10 weeks status post gastric sleeve bariatric surgery  Plan: Proceed to endoscopic evaluation tomorrow. Nature, purpose, risks (perforation, bleeding, anesthesia problems) reviewed and patient agreeable. She recognizes that it will probably be my partner, Dr. Paulita Fujita, who will be doing the procedure. At this time, we are not anticipating esophageal dilatation given the absence of a radiographically demonstrated stricture, but the patient is aware that there is a slight possibility that we would need to do dilatation if an unexpected stricture were encountered.  Depending on the endoscopic findings, I agree with Dr. Redmond Pulling that esophageal manometry might be helpful.   LOS: 0 days    Jahshua Bonito V  08/06/2016, 3:49 PM   Pager  [phone] If no answer or after 5 PM call [phone]

## 2016-08-06 NOTE — Progress Notes (Signed)
Pt transferred to [id number] via w/c. Pt alert,oriented and ambulatory with IV infusing and site dated.

## 2016-08-07 ENCOUNTER — Inpatient Hospital Stay (HOSPITAL_COMMUNITY): Payer: BLUE CROSS/BLUE SHIELD | Admitting: Anesthesiology

## 2016-08-07 ENCOUNTER — Ambulatory Visit (HOSPITAL_COMMUNITY)
Admission: RE | Admit: 2016-08-07 | Payer: BLUE CROSS/BLUE SHIELD | Source: Ambulatory Visit | Admitting: Gastroenterology

## 2016-08-07 ENCOUNTER — Encounter (HOSPITAL_COMMUNITY): Payer: Self-pay | Admitting: Anesthesiology

## 2016-08-07 ENCOUNTER — Encounter (HOSPITAL_COMMUNITY)
Admission: AD | Disposition: A | Discharge status: Home / Self Care | Payer: Self-pay | Source: Ambulatory Visit | Attending: General Surgery

## 2016-08-07 HISTORY — PX: ESOPHAGOGASTRODUODENOSCOPY (EGD) WITH PROPOFOL: SHX5813

## 2016-08-07 LAB — BASIC METABOLIC PANEL
ANION GAP: 6 (ref 5–15)
BUN: <5 mg/dL — ABNORMAL LOW (ref 6–20)
CALCIUM: 8 mg/dL — AB (ref 8.9–10.3)
CO2: 30 mmol/L (ref 22–32)
CREATININE: 0.62 mg/dL (ref 0.44–1.00)
Chloride: 105 mmol/L (ref 101–111)
Glucose, Bld: 112 mg/dL — ABNORMAL HIGH (ref 65–99)
Potassium: 3.1 mmol/L — ABNORMAL LOW (ref 3.5–5.1)
SODIUM: 141 mmol/L (ref 135–145)

## 2016-08-07 LAB — GLUCOSE, CAPILLARY
GLUCOSE-CAPILLARY: 120 mg/dL — AB (ref 65–99)
GLUCOSE-CAPILLARY: 138 mg/dL — AB (ref 65–99)
GLUCOSE-CAPILLARY: 94 mg/dL (ref 65–99)

## 2016-08-07 SURGERY — ESOPHAGOGASTRODUODENOSCOPY (EGD) WITH PROPOFOL
Anesthesia: Monitor Anesthesia Care

## 2016-08-07 MED ORDER — SODIUM CHLORIDE 0.9% FLUSH: 10.0000 mL | INTRAVENOUS | Status: DC | PRN

## 2016-08-07 MED ORDER — FAT EMULSION 20 % IV EMUL
240.0000 mL | INTRAVENOUS | Status: DC
  Administered 2016-08-07: 240 mL via INTRAVENOUS
  Filled 2016-08-07: qty 250

## 2016-08-07 MED ORDER — MIDAZOLAM HCL 2 MG/2ML IJ SOLN
INTRAMUSCULAR | Status: AC
  Filled 2016-08-07: qty 2

## 2016-08-07 MED ORDER — LIDOCAINE 2% (20 MG/ML) 5 ML SYRINGE
INTRAMUSCULAR | Status: AC
  Filled 2016-08-07: qty 5

## 2016-08-07 MED ORDER — INSULIN ASPART 100 UNIT/ML ~~LOC~~ SOLN
0.0000 [IU] | Freq: Four times a day (QID) | SUBCUTANEOUS | Status: DC
  Administered 2016-08-07 – 2016-08-08 (×3): 1 [IU] via SUBCUTANEOUS

## 2016-08-07 MED ORDER — PROPOFOL 10 MG/ML IV BOLUS
INTRAVENOUS | Status: AC
  Filled 2016-08-07: qty 40

## 2016-08-07 MED ORDER — POTASSIUM CHLORIDE 10 MEQ/100ML IV SOLN
10.0000 meq | INTRAVENOUS | Status: AC
  Administered 2016-08-07 (×3): 10 meq via INTRAVENOUS
  Filled 2016-08-07 (×3): qty 100

## 2016-08-07 MED ORDER — TRACE MINERALS CR-CU-MN-SE-ZN 10-1000-500-60 MCG/ML IV SOLN
40.0000 mL/h | INTRAVENOUS | Status: DC
  Administered 2016-08-07: 17:00:00 via INTRAVENOUS
  Filled 2016-08-07: qty 960

## 2016-08-07 MED ORDER — LIDOCAINE 2% (20 MG/ML) 5 ML SYRINGE
INTRAMUSCULAR | Status: DC | PRN
  Administered 2016-08-07: 100 mg via INTRAVENOUS

## 2016-08-07 MED ORDER — KCL IN DEXTROSE-NACL 20-5-0.45 MEQ/L-%-% IV SOLN
INTRAVENOUS | Status: DC
  Administered 2016-08-07: 18:00:00 via INTRAVENOUS
  Filled 2016-08-07 (×2): qty 1000

## 2016-08-07 MED ORDER — SUCRALFATE 1 G PO TABS
1.0000 g | ORAL_TABLET | Freq: Four times a day (QID) | ORAL | Status: DC
  Administered 2016-08-07 – 2016-08-08 (×4): 1 g via ORAL
  Filled 2016-08-07 (×4): qty 1

## 2016-08-07 MED ORDER — MIDAZOLAM HCL 5 MG/5ML IJ SOLN
INTRAMUSCULAR | Status: DC | PRN
  Administered 2016-08-07: 2 mg via INTRAVENOUS

## 2016-08-07 MED ORDER — PROPOFOL 500 MG/50ML IV EMUL
INTRAVENOUS | Status: DC | PRN
  Administered 2016-08-07: 140 ug/kg/min via INTRAVENOUS

## 2016-08-07 MED ORDER — SODIUM CHLORIDE 0.9 % IV SOLN
INTRAVENOUS | Status: DC
  Administered 2016-08-07: 08:00:00 via INTRAVENOUS

## 2016-08-07 MED ORDER — SODIUM CHLORIDE 0.9 % IV SOLN: 30.0000 meq | Freq: Once | INTRAVENOUS | Status: DC

## 2016-08-07 MED ORDER — PROPOFOL 10 MG/ML IV BOLUS
INTRAVENOUS | Status: DC | PRN
  Administered 2016-08-07: 20 mg via INTRAVENOUS

## 2016-08-07 SURGICAL SUPPLY — 14 items

## 2016-08-07 NOTE — H&P (View-Only) (Signed)
Referring Provider:   Dr. 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 female admitted to the hospital today because of a several week history of intensified nausea, food intolerance, and vomiting with dry heaves, necessitating daily trips to the [facility] for IV fluid infusion therapy to maintain hydration.  The patient underwent a gastric sleeve operation on October 2, without any obvious problems area she had the normal postoperative dietary adjustment and was actually doing well until about a month ago when the above symptoms developed. She also had some right upper quadrant pain. A CT scan and an upper GI series were unrevealing for any anatomic issues, including esophageal stricturing, although the barium tablet administered at the end of the upper GI series did remain in the esophagus for several minutes, finally clearing with additional sips of fluid.  At baseline, prior to her surgery, the patient had fairly frequent, diet related reflux symptomatology including acid regurgitation, which she would treat with over-the-counter antacids and occasional Zantac. She has never been on actual prescription medication for that problem.  Currently, the patient does experience some sense of dysphagia, particularly to pills, which seem to stick in the middle of her esophagus. It is difficult for her to get down even 32 ounces of liquid in a day's time; taking small sips is the only thing that she can tolerate.  Dr. Redmond Pulling has requested endoscopic evaluation to check for mucosal or anatomic abnormalities that might he, for the patient's symptoms.     Past Medical History:  Diagnosis Date  . Asthma    allergy induced asthma   . Cancer (HCC)    hx of cervical cancer   . GERD (gastroesophageal reflux disease)   . PONV  (postoperative nausea and vomiting)     Past Surgical History:  Procedure Laterality Date  . BACK SURGERY     x 2   . BREAST ENHANCEMENT SURGERY    . LAPAROSCOPIC GASTRIC SLEEVE RESECTION N/A 05/21/2016   Procedure: LAPAROSCOPIC GASTRIC SLEEVE RESECTION, UPPER ENDOSCOPY;  Surgeon: [name], MD;  Location: [facility];  Service: General;  Laterality: N/A;  . LEEP surgery     . TUBAL LIGATION    . tummy tuck surgery    . uterine ablation       Prior to Admission medications   Medication Sig Start Date End Date Taking? Authorizing Provider  albuterol (PROVENTIL HFA;VENTOLIN HFA) 108 (90 Base) MCG/ACT inhaler Inhale 2 puffs into the lungs every 4 (four) hours as needed for shortness of breath or wheezing. 07/18/15  Yes Historical Provider, MD  esomeprazole (NEXIUM) 40 MG capsule Take 40 mg by mouth daily at 12 noon.   Yes Historical Provider, MD  FLUoxetine (PROZAC) 20 MG capsule Take 60 mg by mouth at bedtime.  03/06/16  Yes Historical Provider, MD  gabapentin (NEURONTIN) 300 MG capsule Take 600 mg by mouth at bedtime.  03/15/16  Yes Historical Provider, MD  HYDROcodone-acetaminophen (NORCO) 7.5-325 MG tablet Take 1 tablet by mouth every 6 (six) hours as needed for pain. 04/25/16  Yes Historical Provider, MD  ondansetron (ZOFRAN-ODT) 4 MG disintegrating tablet Take 1 tablet (4 mg total) by mouth every 6 (six) hours as needed for nausea. 06/14/16  Yes Jerrye Beavers, PA-C  oxyCODONE (ROXICODONE) 5 MG/5ML solution Take 5-10 mLs (5-10 mg total) by mouth every 4 (four)  hours as needed for moderate pain or severe pain. Patient not taking: Reported on 06/12/2016 05/22/16   [name], MD    Current Facility-Administered Medications  Medication Dose Route Frequency Provider Last Rate Last Dose  . diphenhydrAMINE (BENADRYL) 12.5 MG/5ML elixir 12.5 mg  12.5 mg Oral Q6H PRN [name], MD       Or  . diphenhydrAMINE (BENADRYL) injection 12.5 mg  12.5 mg Intravenous Q6H PRN [name], MD      .  enoxaparin (LOVENOX) injection 40 mg  40 mg Subcutaneous Q24H [name], MD      . Derrill Memo ON 08/07/2016] Influenza vac split quadrivalent PF (FLUARIX) injection 0.5 mL  0.5 mL Intramuscular Tomorrow-1000 [name], MD      . magnesium sulfate IVPB 2 g 50 mL  2 g Intravenous Once [name], MD      . Derrill Memo ON 08/07/2016] multivitamin liquid 15 mL  15 mL Oral Daily [name], MD      . ondansetron (ZOFRAN-ODT) disintegrating tablet 4 mg  4 mg Oral Q6H PRN [name], MD       Or  . ondansetron [facility]) injection 4 mg  4 mg Intravenous Q6H PRN [name], MD      . pantoprazole (PROTONIX) injection 40 mg  40 mg Intravenous Q12H [name], MD      . protein supplement (PREMIER PROTEIN) liquid  2 oz Oral QID [name], MD      . sucralfate (CARAFATE) 1 GM/10ML suspension 1 g  1 g Oral Q6H [name], MD      . Derrill Memo ON 08/07/2016] thiamine (B-1) injection 100 mg  100 mg Intravenous Daily [name], MD       Facility-Administered Medications Ordered in Other Encounters  Medication Dose Route Frequency Provider Last Rate Last Dose  . 0.9 % NaCl with KCl 20 mEq/ L  infusion   Intravenous Continuous [name], MD 125 mL/hr at 08/06/16 [phone]    . acetaminophen (TYLENOL) tablet 325-650 mg  325-650 mg Oral Q4H PRN [name], MD       Or  . acetaminophen (TYLENOL) solution 325-650 mg  325-650 mg Per Tube Q4H PRN [name], MD       Or  . acetaminophen (TYLENOL) suppository 325-650 mg  325-650 mg Rectal Q4H PRN [name], MD      . fentaNYL (SUBLIMAZE) injection 12.5 mcg  12.5 mcg Intravenous Q4H PRN [name], MD      . ondansetron (ZOFRAN-ODT) disintegrating tablet 4 mg  4 mg Oral Q4H PRN [name], MD   4 mg at 08/06/16 [id number]   Or  . ondansetron [facility]) injection 4 mg  4 mg Intravenous Q4H PRN [name], MD      . sodium chloride 0.9 % 1,000 mL with thiamine [id number] mg, folic acid 1 mg, multivitamins adult 10 mL infusion   Intravenous Continuous [name], MD 150 mL/hr at  08/06/16 0926      Allergies as of 08/06/2016  . (No Known Allergies)    No family history on file.  Social History   Social History  . Marital status: Married    Spouse name: Ethelreda Klitzke  . Number of children: 1  . Years of education: 39   Occupational History  . unemployed    Social History Main Topics  . Smoking status: Current Some Day Smoker    Types: Cigarettes  . Smokeless tobacco: Never Used     Comment: 2-3 cigarettes/week  .  Alcohol use Yes     Comment: '7 times my whole life'  . Drug use: No  . Sexual activity: Not Currently    Partners: Male    Birth control/ protection: Surgical   Other Topics Concern  . Not on file   Social History Narrative  . No narrative on file    Review of Systems: The patient has lost about 35 pounds since her bariatric operation 10 weeks ago. Upper tract symptoms as per history of present illness. No history of cardiopulmonary problems or sleep apnea. No skin problems, urinary symptoms, or joint effusions. She is on gabapentin because of chronic right lower extremity discomfort which has persisted following 2 back operations.  Physical Exam: Vital signs in last 24 hours: Temp:  [98.4 F (36.9 C)] 98.4 F (36.9 C) (12/18 1404) Pulse Rate:  [61] 61 (12/18 1404) Resp:  [15] 15 (12/18 1404) BP: (94)/(55) 94/55 (12/18 1404) SpO2:  [97 %] 97 % (12/18 1404) Weight:  [98 kg (216 lb)] 98 kg (216 lb) (12/18 1319) Last BM Date: 08/04/16 General:   Alert, moderately overweight pleasant and cooperative in NAD Head:  Normocephalic and atraumatic. Eyes:  Sclera clear, no icterus.   Conjunctiva pale. Neck:   No masses or thyromegaly. Lungs:  Clear throughout to auscultation.   No wheezes, crackles, or rhonchi. No evident respiratory distress. Heart:   Regular rate and rhythm; no murmurs, clicks, rubs,  or gallops. Abdomen:   Adipose, soft, nontender, nontympanitic, and nondistended. No masses, hepatosplenomegaly or ventral hernias noted.  Normal bowel sounds, without bruits, guarding, or rebound.   Msk:   Symmetrical without gross deformities. Extremities:   Without edema. Neurologic:  Alert and coherent;  grossly normal neurologically. Skin:  Intact without significant lesions or rashes. Cervical Nodes:  No significant cervical adenopathy. Psych:   Alert and cooperative. Normal mood and affect.  Intake/Output from previous day: No intake/output data recorded. Intake/Output this shift: No intake/output data recorded.  Lab Results:  Recent Labs  08/06/16 1429  WBC 4.3  HGB 12.5  HCT 35.3*  PLT 172   BMET  Recent Labs  08/06/16 1429  NA 143  K 3.4*  CL 106  CO2 26  GLUCOSE 65  BUN <5*  CREATININE 0.66  CALCIUM 8.1*   LFT  Recent Labs  08/06/16 1429  PROT 5.2*  ALBUMIN 2.6*  AST 88*  ALT 53  ALKPHOS 64  BILITOT 1.2   PT/INR No results for input(s): LABPROT, INR in the last 72 hours.  Studies/Results: No results found.  Impression: 1. Persistent upper tract symptoms (nausea, vomiting, food intolerance) of unclear cause. 2. Dysphagia, apparently due to esophageal dysmotility given the absence of stricture on barium contrast radiography, but with hang-up of a barium tablet for a fairly prolonged period of time. 3. Now 10 weeks status post gastric sleeve bariatric surgery  Plan: Proceed to endoscopic evaluation tomorrow. Nature, purpose, risks (perforation, bleeding, anesthesia problems) reviewed and patient agreeable. She recognizes that it will probably be my partner, Dr. Paulita Fujita, who will be doing the procedure. At this time, we are not anticipating esophageal dilatation given the absence of a radiographically demonstrated stricture, but the patient is aware that there is a slight possibility that we would need to do dilatation if an unexpected stricture were encountered.  Depending on the endoscopic findings, I agree with Dr. Redmond Pulling that esophageal manometry might be helpful.   LOS: 0 days    Hriday Stai V  08/06/2016, 3:49 PM   Pager  [phone] If no answer or after 5 PM call [phone]

## 2016-08-07 NOTE — Transfer of Care (Signed)
Immediate Anesthesia Transfer of Care Note  Patient: Tracie Johnson  Procedure(s) Performed: Procedure(s): ESOPHAGOGASTRODUODENOSCOPY (EGD) WITH PROPOFOL (N/A)  Patient Location: Endoscopy Unit  Anesthesia Type:MAC  Level of Consciousness: awake  Airway & Oxygen Therapy: Patient Spontanous Breathing and Patient connected to nasal cannula oxygen  Post-op Assessment: Report given to RN and Post -op Vital signs reviewed and stable  Post vital signs: Reviewed and stable  Last Vitals:  Vitals:   08/07/16 0520 08/07/16 0709  BP: 101/65 (!) 105/55  Pulse: 60 65  Resp: 16 15  Temp: 36.5 C 36.8 C    Last Pain:  Vitals:   08/07/16 0709  TempSrc: Oral         Complications: No apparent anesthesia complications

## 2016-08-07 NOTE — Op Note (Signed)
[facility] Patient Name: Tracie Johnson Procedure Date: 08/07/2016 MRN: [id number] Attending MD: Arta Silence , MD Date of Birth: April 27, 1981 CSN: [id number] Age: 35 Admit Type: Inpatient Procedure:                Upper GI endoscopy Indications:              Dysphagia, Nausea with vomiting, 8-weeks post                            sleeve gastrectomy Providers:                Carolynn Comment, RN, Cherylynn Ridges, Technician,                            Danley Danker, CRNA, Arta Silence, MD Referring MD:             [name], MD [facility]) Medicines:                Monitored Anesthesia Care Complications:            No immediate complications. Estimated Blood Loss:     Estimated blood loss: none. Procedure:                Pre-Anesthesia Assessment:                           - Prior to the procedure, a History and Physical                            was performed, and patient medications and                            allergies were reviewed. The patient's tolerance of                            previous anesthesia was also reviewed. The risks                            and benefits of the procedure and the sedation                            options and risks were discussed with the patient.                            All questions were answered, and informed consent                            was obtained. Prior Anticoagulants: The patient has                            taken Lovenox (enoxaparin), last dose was 1 day                            prior to procedure. ASA Grade Assessment: III - A  patient with severe systemic disease. After                            reviewing the risks and benefits, the patient was                            deemed in satisfactory condition to undergo the                            procedure.                           After obtaining informed consent, the endoscope was   passed under direct vision. Throughout the                            procedure, the patient's blood pressure, pulse, and                            oxygen saturations were monitored continuously. The                            [id number]) scope was introduced through the                            mouth, and advanced to the prepyloric region,                            stomach. The upper GI endoscopy was accomplished                            without difficulty. The patient tolerated the                            procedure well. Scope In: Scope Out: Findings:      LA Grade A (one or more mucosal breaks less than 5 mm, not extending       between tops of 2 mucosal folds) esophagitis was found.      The exam of the esophagus was otherwise normal. No esophageal stricture,       ring, or mass was noted.      Normal-appearing gastric sleeve anatomy.      The exam of the stomach was otherwise normal. Pylorus was patent. Lots       of coiling of scope as I tried to traverse the pylorus; I elected not to       continue for fear of precipitating disruption of recent surgical       staples; thus the proximal duodenum was not examined. Impression:               - LA Grade A reflux esophagitis.                           - Normal-appearing gastric sleeve.                           - Patent pylorus.                           -  Acid reflux is leading consideration for cause of                            patient's post-operative dysphagia and                            nausea/vomiting symptoms. Moderate Sedation:      None Recommendation:           - Return patient to hospital ward for ongoing care.                           - Resume previous diet today.                           - Continue present medications. Maximize                            antisecretory (PPI) and cytoprotective (sucralfate)                            and antiemetic therapies.                           - Dr. Redmond Pulling  was present during today's examination                            and watched the procedure.                           [facility] will follow. Procedure Code(s):        --- Professional ---                           [phone], 52, Esophagogastroduodenoscopy, flexible,                            transoral; diagnostic, including collection of                            specimen(s) by brushing or washing, when performed                            (separate procedure) Diagnosis Code(s):        --- Professional ---                           K21.0, Gastro-esophageal reflux disease with                            esophagitis                           R13.10, Dysphagia, unspecified                           R11.2, Nausea with vomiting, unspecified CPT copyright 2016 American Medical Association. All rights reserved. The codes documented in this report  are preliminary and upon coder review may  be revised to meet current compliance requirements. Arta Silence, MD 08/07/2016 7:55:59 AM This report has been signed electronically. Number of Addenda: 0

## 2016-08-07 NOTE — Progress Notes (Signed)
Peripherally Inserted Central Catheter/Midline Placement  The IV Nurse has discussed with the patient and/or persons authorized to consent for the patient, the purpose of this procedure and the potential benefits and risks involved with this procedure.  The benefits include less needle sticks, lab draws from the catheter, and the patient may be discharged home with the catheter. Risks include, but not limited to, infection, bleeding, blood clot (thrombus formation), and puncture of an artery; nerve damage and irregular heartbeat and possibility to perform a PICC exchange if needed/ordered by physician.  Alternatives to this procedure were also discussed.  Bard Power PICC patient education guide, fact sheet on infection prevention and patient information card has been provided to patient /or left at bedside.    PICC/Midline Placement Documentation        Tracie Johnson 08/07/2016, 10:20 AM

## 2016-08-07 NOTE — Progress Notes (Signed)
PHARMACY - ADULT TOTAL PARENTERAL NUTRITION CONSULT NOTE   Pharmacy Consult for TPN Indication: food intolerance s/p bariatric surgery  Patient Measurements: Height: 5\' 3"  (160 cm) Weight: 216 lb (98 kg) IBW/kg (Calculated) : 52.4 TPN AdjBW (KG): 63.8 Body mass index is 38.26 kg/m.  Insulin Requirements: no hx DM  Current Nutrition: bariatricCL diet  IVF: D5W & 1/2 NS with 20 mEq KCl at 100 ml/hr  Central access: PICC placed 12/19 TPN start date: 12/19  ASSESSMENT                                                                                                          HPI: 24 yoF admitted 12/18 with food intolerance s/p bariatric surgery. Lap sleeve gastrectomy 05/21/16, re-admit 10/24-26 with difficulty po intake, RUQ pain-no cholecystitis. Last week + noted to [facility] that po intake decreasing, heartburn, reflux; ordered outpt IV fluids. Admit from [facility] clinic for TPN, consideration of J tube.  Significant events:   Today:   Glucose - slightly elevated on CL diet  Electrolytes - K low today continue to replete, Mag bolus yesterday  Renal - wnl  LFTs -unremarkable  TGs -pending  Prealbumin - pending  NUTRITIONAL GOALS                                                                                             RD recs: pending Clinimix / at a goal rate of 60ml/hr + 20% fat emulsion at 85ml/hr to provide: 96g/day protein, 2170 Kcal/day.  **There is a national back order on TPN solutions, we have only the 5/20 formulation available at this time **  PLAN                                                                                                                         At 1800 today:  Start Clinimix E 5/20 at 40 ml/hr.  20% fat emulsion at 10 ml/hr.  Plan to advance as tolerated to the goal rate.  TPN to contain standard multivitamins and trace elements.  Reduce IVF to 60 ml/hr.  Add SSI - Novolog sensitive correction scale q6hr   TPN lab panels on Mondays &  Thursdays.  Full labs in am  F/u daily.  Minda Ditto PharmD Pager [phone] 08/07/2016, 10:03 AM

## 2016-08-07 NOTE — Progress Notes (Signed)
Received the referral for home TPN. Spoke with patient at bedside, she has a PICC in place currently. Discussed [place] services with her, she had no preference for [facility] agency. Contacted [facility] for referral, awaiting final orders.

## 2016-08-07 NOTE — Progress Notes (Signed)
Patient ID: Tracie Johnson, female   DOB: 10/21/1980, 35 y.o.   MRN: [id number]  Progress Note: Metabolic and Bariatric Surgery Service   Subjective: Had endo this am. I was present for procedure. Had picc line this am. No abd pain. Still with occasional nausea. Liquids are too sweet. Working on unjury shake now  Objective: Vital signs in last 24 hours: Temp:  [97.7 F (36.5 C)-98.4 F (36.9 C)] 98.2 F (36.8 C) (12/19 0709) Pulse Rate:  [59-68] 61 (12/19 0820) Resp:  [13-19] 13 (12/19 0820) BP: (85-111)/(41-66) 89/51 (12/19 0820) SpO2:  [97 %-100 %] 99 % (12/19 0820) Last BM Date: 08/04/16  Intake/Output from previous day: 12/18 0701 - 12/19 0700 In: 1600 [P.O.:535; I.V.:1065] Out: 350 [Urine:350] Intake/Output this shift: Total I/O In: 150 [I.V.:150] Out: 400 [Urine:400]  Lungs: cta  Cardiovascular: reg  Abd: soft, nt, nd  Extremities: no edema  Neuro: nonfocal, alert  Lab Results: CBC   Recent Labs  08/06/16 1429  WBC 4.3  HGB 12.5  HCT 35.3*  PLT 172   BMET  Recent Labs  08/06/16 1429 08/07/16 0602  NA 143 141  K 3.4* 3.1*  CL 106 105  CO2 26 30  GLUCOSE 65 112*  BUN <5* <5*  CREATININE 0.66 0.62  CALCIUM 8.1* 8.0*   PT/INR No results for input(s): LABPROT, INR in the last 72 hours. ABG No results for input(s): PHART, HCO3 in the last 72 hours.  Invalid input(s): PCO2, PO2  Studies/Results:  Anti-infectives: Anti-infectives    None      Medications: Scheduled Meds: . enoxaparin (LOVENOX) injection  40 mg Subcutaneous Q24H  . FLUoxetine  60 mg Oral QHS  . gabapentin  600 mg Oral QHS  . Influenza vac split quadrivalent PF  0.5 mL Intramuscular Tomorrow-1000  . multivitamin  15 mL Oral Daily  . pantoprazole (PROTONIX) IV  40 mg Intravenous Q12H  . potassium chloride  10 mEq Intravenous Q1 Hr x 3  . protein supplement shake  2 oz Oral QID  . sucralfate  1 g Oral Q6H  . thiamine injection  100 mg Intravenous Daily   Continuous  Infusions: . dextrose 5 % and 0.45 % NaCl with KCl 20 mEq/L 100 mL/hr at 08/07/16 0839  . Marland KitchenTPN (CLINIMIX-E) Adult     And  . fat emulsion     PRN Meds:.diphenhydrAMINE **OR** diphenhydrAMINE, ondansetron **OR** ondansetron (ZOFRAN) IV, sodium chloride flush  Assessment/Plan: Patient Active Problem List   Diagnosis Date Noted  . Malnutrition ([place]) 08/06/2016  . Dehydration 08/02/2016  . Protein-calorie malnutrition, severe 06/14/2016  . Vomiting 06/12/2016  . Obesity, Class III, BMI 40-49.9 (morbid obesity) ([place]) 05/21/2016  . Lumbosacral pain, chronic 05/21/2016  . History of prediabetes 05/21/2016  . GERD (gastroesophageal reflux disease) 05/21/2016  . PCOS (polycystic ovarian syndrome) 05/21/2016  . 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 Was able to retroflex in sleeve. Some reflux changes and very mild gastritis. Discussed findings with pt and mother.  Right now my recommendation is bid ppi, scheduled carafate. Po as tolerated.  Will try chicken soup shakes since not that sweet Will supplement nutrition with TPN. Her sleeve anatomy is normal so I don't think we are at the point of surgical J tube yet.  Change liquid meds to solids. Pt doesn't like  solution test Cont chemical vte prophylaxis Hypokalemia - replace potassium  Home in next 24-48 hrs with HHN/TPN Disposition:  LOS: 1 day  The patient does not meet criteria for discharge because She did not meet oral intake goals and is at high risk of dehydration  Gayland Curry, MD [phone] [facility]

## 2016-08-07 NOTE — Interval H&P Note (Signed)
History and Physical Interval Note:  08/07/2016 7:31 AM  Tracie Johnson  has presented today for surgery, with the diagnosis of food intolerance post bariatric surgery   The various methods of treatment have been discussed with the patient and family. After consideration of risks, benefits and other options for treatment, the patient has consented to  Procedure(s): ESOPHAGOGASTRODUODENOSCOPY (EGD) WITH PROPOFOL (N/A) as a surgical intervention .  The patient's history has been reviewed, patient examined, no change in status, stable for surgery.  I have reviewed the patient's chart and labs.  Questions were answered to the patient's satisfaction.     Landry Dyke

## 2016-08-07 NOTE — Anesthesia Postprocedure Evaluation (Signed)
Anesthesia Post Note  Patient: Tracie Johnson  Procedure(s) Performed: Procedure(s) (LRB): ESOPHAGOGASTRODUODENOSCOPY (EGD) WITH PROPOFOL (N/A)  Patient location during evaluation: PACU Anesthesia Type: MAC Level of consciousness: awake and alert Pain management: pain level controlled Vital Signs Assessment: post-procedure vital signs reviewed and stable Respiratory status: spontaneous breathing, nonlabored ventilation, respiratory function stable and patient connected to nasal cannula oxygen Cardiovascular status: stable and blood pressure returned to baseline Anesthetic complications: no       Last Vitals:  Vitals:   08/07/16 0820 08/07/16 1400  BP: (!) 89/51 93/60  Pulse: 61 68  Resp: 13 16  Temp:  36.6 C    Last Pain:  Vitals:   08/07/16 1400  TempSrc: Oral                 Courtnie Brenes EDWARD

## 2016-08-08 ENCOUNTER — Encounter (HOSPITAL_COMMUNITY): Payer: Self-pay | Admitting: Gastroenterology

## 2016-08-08 LAB — GLUCOSE, CAPILLARY
Glucose-Capillary: 131 mg/dL — ABNORMAL HIGH (ref 65–99)
Glucose-Capillary: 125 mg/dL — ABNORMAL HIGH (ref 65–99)

## 2016-08-08 LAB — CBC
HEMATOCRIT: 34.1 % — AB (ref 36.0–46.0)
HEMOGLOBIN: 11.9 g/dL — AB (ref 12.0–15.0)
MCH: 30.2 pg (ref 26.0–34.0)
MCHC: 34.9 g/dL (ref 30.0–36.0)
MCV: 86.5 fL (ref 78.0–100.0)
Platelets: 144 10*3/uL — ABNORMAL LOW (ref 150–400)
RBC: 3.94 MIL/uL (ref 3.87–5.11)
RDW: 14.8 % (ref 11.5–15.5)
WBC: 4.5 10*3/uL (ref 4.0–10.5)

## 2016-08-08 LAB — PREALBUMIN: PREALBUMIN: 5.1 mg/dL — AB (ref 18–38)

## 2016-08-08 LAB — COMPREHENSIVE METABOLIC PANEL
ALBUMIN: 2.5 g/dL — AB (ref 3.5–5.0)
ALK PHOS: 59 U/L (ref 38–126)
ALT: 52 U/L (ref 14–54)
ANION GAP: 2 — AB (ref 5–15)
AST: 84 U/L — ABNORMAL HIGH (ref 15–41)
BILIRUBIN TOTAL: 0.7 mg/dL (ref 0.3–1.2)
CALCIUM: 8.4 mg/dL — AB (ref 8.9–10.3)
CO2: 29 mmol/L (ref 22–32)
Chloride: 109 mmol/L (ref 101–111)
Creatinine, Ser: 0.46 mg/dL (ref 0.44–1.00)
GFR calc Af Amer: >60 mL/min (ref >60)
GLUCOSE: 141 mg/dL — AB (ref 65–99)
POTASSIUM: 3.2 mmol/L — AB (ref 3.5–5.1)
Sodium: 140 mmol/L (ref 135–145)
TOTAL PROTEIN: 4.9 g/dL — AB (ref 6.5–8.1)

## 2016-08-08 LAB — DIFFERENTIAL
BASOS PCT: 0 %
Basophils Absolute: 0 10*3/uL (ref 0.0–0.1)
EOS ABS: 0.2 10*3/uL (ref 0.0–0.7)
EOS PCT: 4 %
LYMPHS ABS: 1.5 10*3/uL (ref 0.7–4.0)
Lymphocytes Relative: 34 %
MONOS PCT: 10 %
Monocytes Absolute: 0.5 10*3/uL (ref 0.1–1.0)
Neutro Abs: 2.3 10*3/uL (ref 1.7–7.7)
Neutrophils Relative %: 52 %

## 2016-08-08 LAB — MAGNESIUM: Magnesium: 2 mg/dL (ref 1.7–2.4)

## 2016-08-08 LAB — PHOSPHORUS: PHOSPHORUS: 2 mg/dL — AB (ref 2.5–4.6)

## 2016-08-08 LAB — TRIGLYCERIDES: TRIGLYCERIDES: 76 mg/dL (ref <150)

## 2016-08-08 MED ORDER — PRO-STAT SUGAR FREE PO LIQD
30.0000 mL | Freq: Two times a day (BID) | ORAL | Status: DC
  Administered 2016-08-08: 30 mL via ORAL
  Filled 2016-08-08: qty 30

## 2016-08-08 MED ORDER — SUCRALFATE 1 G PO TABS: 1.0000 g | ORAL_TABLET | Freq: Three times a day (TID) | ORAL | 1 refills | Status: AC

## 2016-08-08 MED ORDER — HEPARIN SOD (PORK) LOCK FLUSH 100 UNIT/ML IV SOLN
250.0000 [IU] | INTRAVENOUS | Status: AC | PRN
  Administered 2016-08-08: 250 [IU]

## 2016-08-08 MED ORDER — POTASSIUM PHOSPHATES 15 MMOLE/5ML IV SOLN
20.0000 mmol | Freq: Once | INTRAVENOUS | Status: AC
  Administered 2016-08-08: 20 mmol via INTRAVENOUS
  Filled 2016-08-08: qty 6.67

## 2016-08-08 MED ORDER — ESOMEPRAZOLE MAGNESIUM 40 MG PO CPDR: 40.0000 mg | DELAYED_RELEASE_CAPSULE | Freq: Two times a day (BID) | ORAL | 0 refills | Status: AC

## 2016-08-08 NOTE — Progress Notes (Signed)
[place] pt for [facility] this admission  [facility] will provide [facility] Team for home TPN upon DC.  In hospital teaching with pt to support independence at home once further teaching provided in the home.  [facility] hospital team will follow while an inpatient to support transition home.   If patient discharges after hours, please call [phone].   Larry Sierras 08/08/2016, 8:56 AM

## 2016-08-08 NOTE — Progress Notes (Signed)
Patient ID: Tracie Johnson, female   DOB: 1981/01/16, 35 y.o.   MRN: [id number]  Progress Note: Metabolic and Bariatric Surgery Service   Subjective: Had nausea last pm but took in some PO.   Objective: Vital signs in last 24 hours: Temp:  [97.9 F (36.6 C)-98.6 F (37 C)] 98.1 F (36.7 C) (12/20 0550) Pulse Rate:  [59-68] 60 (12/20 0550) Resp:  [13-19] 16 (12/20 0550) BP: (85-103)/(51-72) 94/59 (12/20 0550) SpO2:  [97 %-100 %] 98 % (12/20 0550) Last BM Date: 08/07/16  Intake/Output from previous day: 12/19 0701 - 12/20 0700 In: 3763.8 [P.O.:1090; I.V.:2373.8; IV Piggyback:300] Out: 400 [Urine:400] Intake/Output this shift: No intake/output data recorded.  Lungs: cta  Cardiovascular: reg  Abd: soft, nt, nd  Extremities: no edema  Neuro: nonfocal, asleep, easily awakens and appropriate  Lab Results: CBC   Recent Labs  08/06/16 1429 08/08/16 0432  WBC 4.3 4.5  HGB 12.5 11.9*  HCT 35.3* 34.1*  PLT 172 144*   BMET  Recent Labs  08/07/16 0602 08/08/16 0432  NA 141 140  K 3.1* 3.2*  CL 105 109  CO2 30 29  GLUCOSE 112* 141*  BUN <5* <5*  CREATININE 0.62 0.46  CALCIUM 8.0* 8.4*   PT/INR No results for input(s): LABPROT, INR in the last 72 hours. ABG No results for input(s): PHART, HCO3 in the last 72 hours.  Invalid input(s): PCO2, PO2  Studies/Results:  Anti-infectives: Anti-infectives    None      Medications: Scheduled Meds: . enoxaparin (LOVENOX) injection  40 mg Subcutaneous Q24H  . FLUoxetine  60 mg Oral QHS  . gabapentin  600 mg Oral QHS  . Influenza vac split quadrivalent PF  0.5 mL Intramuscular Tomorrow-1000  . insulin aspart  0-9 Units Subcutaneous Q6H  . multivitamin  15 mL Oral Daily  . pantoprazole (PROTONIX) IV  40 mg Intravenous Q12H  . potassium phosphate IVPB (mmol)  20 mmol Intravenous Once  . protein supplement shake  2 oz Oral QID  . sucralfate  1 g Oral Q6H  . thiamine injection  100 mg Intravenous Daily   Continuous  Infusions: . dextrose 5 % and 0.45 % NaCl with KCl 20 mEq/L 60 mL/hr at 08/07/16 1730  . Marland KitchenTPN (CLINIMIX-E) Adult 40 mL/hr (08/07/16 1722)   And  . fat emulsion 240 mL (08/07/16 1722)   PRN Meds:.diphenhydrAMINE **OR** diphenhydrAMINE, ondansetron **OR** ondansetron (ZOFRAN) IV, sodium chloride flush  Assessment/Plan: Patient Active Problem List   Diagnosis Date Noted  . Malnutrition ([place]) 08/06/2016  . Dehydration 08/02/2016  . Protein-calorie malnutrition, severe 06/14/2016  . Vomiting 06/12/2016  . Obesity, Class III, BMI 40-49.9 (morbid obesity) ([place]) 05/21/2016  . Lumbosacral pain, chronic 05/21/2016  . History of prediabetes 05/21/2016  . GERD (gastroesophageal reflux disease) 05/21/2016  . PCOS (polycystic ovarian syndrome) 05/21/2016  . 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 Was able to retroflex in sleeve. Some reflux changes and very mild gastritis.  Right now my recommendation is bid ppi, scheduled carafate. Po as tolerated.  Will try chicken soup shakes since not that sweet Will supplement nutrition with TPN. Her sleeve anatomy is normal so I don't think we are at the point of surgical J tube yet.  Change liquid meds to solids. Pt doesn't like solution test Cont chemical vte prophylaxis  Hypokalemia - replace potassium  Home today with HHN/TPN Disposition:  LOS: 2 days  The patient should be discharged from the hospital today  Gayland Curry, MD [phone] [facility]

## 2016-08-08 NOTE — Progress Notes (Signed)
Initial Nutrition Assessment  DOCUMENTATION CODES:   Non-severe (moderate) malnutrition in context of acute illness/injury  INTERVENTION:   Monitor magnesium, potassium, and phosphorus daily for at least 3 days, MD to replete as needed, as pt is at risk for refeeding syndrome given malnutrition status, Poor PO intake PTA and Low K and Phos levels.  -TPN per Pharmacy -Estimated needs below -D/c Premier Protein per pt request -Provide Prostat liquid protein PO 30 ml QID with meals, each supplement provides 100 kcal, 15 grams protein. -Encourage sips of liquids as tolerated -Reviewed appropriate food choices and vitamin supplements with patient -RD to continue to monitor for needs  NUTRITION DIAGNOSIS:   Malnutrition related to acute illness as evidenced by percent weight loss, energy intake < 75% for > 7 days.  GOAL:   Patient will meet greater than or equal to 90% of their needs  MONITOR:   PO intake, Supplement acceptance, Labs, Weight trends, I & O's, Other (Comment) (TPN)  REASON FOR ASSESSMENT:   Consult New TPN/TNA  ASSESSMENT:   35 y.o. female admitted to the hospital today because of a several week history of intensified nausea, food intolerance, and vomiting with dry heaves, necessitating daily trips to the [facility] Center for IV fluid infusion therapy to maintain hydration.  Per discussion with Pharmacy, pt to discharge today with [facility] managing TPN. Pt is able to take in some PO. Per chart review, pt followed up with outpatient RD and was not meeting fluid needs at that time but was tolerating protein solid foods. She was not tolerating protein shakes. Pt was drinking ~40 oz of fluid.   Per discussion with patient today: Fluid intake: No more than 32 oz Vitamin supplements: 2 Flintstones multivitamins with iron, 2 Tums calcium tablets daily. BM: 12/19, loose stools. Sometimes has constipation but mainly when her intake is low. Reflux: Yes, takes Nexium. States MD  will increase this dose. Drinking while eating?: No Protein consumption: No protein shakes or powders. Consumes chicken, steak, fish and beans as solid protein.  Pt states she does not tolerate Unjury supplements (even the unflavored) and GenPro unflavored protein.  Micronutrient status: Denies hair loss, changed in nail strength, bleeding gums, excessive dryness to skin, and weakness in arms and legs.  Patient currently trying to drink some chicken broth (from unit) and is not drinking much d/t not liking taste. Pt experiencing sensitivity to sweet tasting foods. Pt is willing to try Prostat supplements. Reviewed strategies to take supplement to help with off taste.  Medications: Liquid MVI daily, KPhos infusion once, IV Thiamine daily, Carafate tablet every 6 hours, Zofran-ODT PRN Labs reviewed: CBGs: 131-138 Low K, Phos  Mg WNL TG: 76 mg/dL  Diet Order:  Diet bariatric clear liquid Room service appropriate? Yes; Fluid consistency: Thin TPN (CLINIMIX-E) Adult  Skin:  Reviewed, no issues  Last BM:  12/19  Height:   Ht Readings from Last 1 Encounters:  08/06/16 5\' 3"  (1.6 m)    Weight:   Wt Readings from Last 1 Encounters:  08/06/16 216 lb (98 kg)    Ideal Body Weight:  52.3 kg  BMI:  Body mass index is 38.26 kg/m.  Estimated Nutritional Needs:   Kcal:  [id number]  Protein:  70-80g  Fluid:  2L/day  EDUCATION NEEDS:   Education needs addressed  Clayton Bibles, MS, RD, LDN Pager: [phone] After Hours Pager: [phone]

## 2016-08-08 NOTE — Progress Notes (Signed)
PHARMACY - ADULT TOTAL PARENTERAL NUTRITION CONSULT NOTE   Pharmacy Consult for TPN Indication: food intolerance s/p bariatric surgery  Patient Measurements: Height: 5\' 3"  (160 cm) Weight: 216 lb (98 kg) IBW/kg (Calculated) : 52.4 TPN AdjBW (KG): 63.8 Body mass index is 38.26 kg/m.  Insulin Requirements: 2 units, CBGs in 130's on TPN No hx DM  Current Nutrition: bariatricCL diet  IVF: D5W & 1/2 NS with 20 mEq KCl at 60 ml/hr  Central access: PICC placed 12/19 TPN start date: 12/19  ASSESSMENT                                                                                                          HPI: 57 yoF admitted 12/18 with food intolerance s/p bariatric surgery. Lap sleeve gastrectomy 05/21/16, re-admit 10/24-26 with difficulty po intake, RUQ pain-no cholecystitis. Last week + noted to [facility] that po intake decreasing, heartburn, reflux; ordered outpt IV fluids. Admit from [facility] clinic for TPN, consideration of J tube.  Significant events:   Today:   Glucose - elevated, on CL diet  Electrolytes - K cont low, Phos low, Mag wnl, Corr Ca 9.6  Renal - wnl  LFTs -AST elevated, otherwise unremarkable  TGs -pending  Prealbumin - pending  NUTRITIONAL GOALS                                                                                             RD recs: pending Clinimix / at a goal rate of 54ml/hr + 20% fat emulsion at 59ml/hr to provide: 96g/day protein, 2170 Kcal/day.  **There is a national back order on TPN solutions, we have only the 5/20 formulation available at this time **  PLAN                                                                                                                          K Phos bolus this am for electrolyte repletion  Plan discharge home today on TPN per home health (RN liason had contacted me yesterday regarding discharge planning)  Minda Ditto PharmD Pager [phone] 08/08/2016, 8:04 AM

## 2016-08-08 NOTE — Progress Notes (Signed)
Assessment unchanged.  Scripts were given per MD order.  All questions pertaining to D/C we answered.  Pt was D/C'd via ambulation and accompanied by NT/NS.  Roselind Rily

## 2016-08-09 NOTE — Discharge Summary (Signed)
Physician Discharge Summary  Tracie Johnson [id number] DOB: 12-Jul-1981 DOA: 08/06/2016  PCP: Evelene Croon, MD  Admit date: 08/06/2016 Discharge date: 08/09/2016  Recommendations for Outpatient Follow-up:  1. Home health nursing for TPN  Follow-up [place] Follow up.   Why:  assist with TPN Contact information: [address] [place] [address] [phone]          Discharge Diagnoses:  1. Status post laparoscopic sleeve gastrectomy 05/21/2016 2. Malnutrition 3. Nausea 4. Gastroesophageal reflux with gastritis 5. History of prediabetes 6. Lumbar sacral pain 7. Obesity  Surgical Procedure: Upper endoscopy by Dr. Paulita Fujita December 19  Discharge Condition: Good Disposition: Home with home health services  Diet recommendation: Bariatric full  Filed Weights   08/06/16 1319  Weight: 98 kg (216 lb)    History of present illness:  35 year old female status post laparoscopic sleeve gastrectomy October 2 was brought in for ongoing difficulties with oral intake. She was readmitted from October 24 through the 26 with difficulties with oral intake, right upper quadrant pain. CT imaging at that time was unremarkable. Nuclear medicine scan showed no evidence of gallbladder infection. Oral intake improved and she was discharged home. I saw her in the clinic a week or 2 later and her oral intake was consistently improving. She contacted [place] about a week and a half ago stating that her liquid intake was going down, she was having some nausea but no fever or abdominal pain. She was also having some heartburn. She was arranged to have outpatient IV fluids on several occasions and continued on Protonix and Zofran. Late last week nutrition labs indicated a prealbumin level of around 6. Last week when she came down for additional fluids we also obtained an upper GI at that time which showed no leak or obstruction however she did have evidence of  reflux with intermittent boluses of liquid intake. She also reported sensation of immediate nausea with placement of food or liquids in her oropharynx with some retching. We continued her on scheduled PPI and scheduled Zofran. She was scheduled for return to the infusion clinic today for more fluids. I saw her today in the infusion center and she reported ongoing difficulty with liquid intake but she overall felt better. She denies any fevers, chills, abdominal pain. She denies any regurgitation. She did fill what she described as swelling in her upper chest or lower neck  Hospital Course:  She is admitted for additional IV fluids and correction of hypokalemia. Because her oral intake was poor and she was malnourished she underwent placement of a PICC line to start parenteral nutrition. Gastroenterology was consult did to perform endoscopy to directly evaluate her sleeve anatomy and evaluate her mucosa. She was found to have reflux as well as some mild gastritis. However there is no evidence of obstruction or esophageal stricture. The gastroenterologist was able to retroflex in her sleeve. She was continued on oral diet as tolerated. Her nausea was managed with antibiotics. Her PPI therapy was increased to twice a day. And she was started on Carafate. She did not like the taste of the liquid multivitamin or liquid Carafate and she was switched to tablets. Since the anatomy of her sleeve was normal without any evidence of narrowing, stricture or obstruction I did not think that we are at the point to need to do a feeding jejunostomy tube. Therefore I recommended starting parenteral nutrition to supplement her oral intake to try to get her over  this hump. I recommended that she take twice a day PPI therapy as well as Carafate at least 4 times a day. She will also be given additional prescription for ongoing Zofran to help with her nausea.   Discharge Instructions  Discharge Instructions    Call MD for:     Complete by:  As directed    Temperature >101   Call MD for:  hives    Complete by:  As directed    Call MD for:  persistant dizziness or light-headedness    Complete by:  As directed    Call MD for:  persistant nausea and vomiting    Complete by:  As directed    Call MD for:  redness, tenderness, or signs of infection (pain, swelling, redness, odor or green/yellow discharge around incision site)    Complete by:  As directed    Call MD for:  severe uncontrolled pain    Complete by:  As directed    Discharge instructions    Complete by:  As directed    See [facility] discharge instructions   Increase activity slowly    Complete by:  As directed      Allergies as of 08/08/2016   No Known Allergies     Medication List    TAKE these medications   albuterol 108 (90 Base) MCG/ACT inhaler Commonly known as:  PROVENTIL HFA;VENTOLIN HFA Inhale 2 puffs into the lungs every 4 (four) hours as needed for shortness of breath or wheezing.   esomeprazole 40 MG capsule Commonly known as:  NEXIUM Take 1 capsule (40 mg total) by mouth 2 (two) times daily before a meal. What changed:  when to take this   FLUoxetine 20 MG capsule Commonly known as:  PROZAC Take 60 mg by mouth at bedtime.   gabapentin 300 MG capsule Commonly known as:  NEURONTIN Take 600 mg by mouth at bedtime.   HYDROcodone-acetaminophen 7.5-325 MG tablet Commonly known as:  NORCO Take 1 tablet by mouth every 6 (six) hours as needed for pain.   ondansetron 4 MG disintegrating tablet Commonly known as:  ZOFRAN-ODT Take 1 tablet (4 mg total) by mouth every 6 (six) hours as needed for nausea.   sucralfate 1 g tablet Commonly known as:  CARAFATE Take 1 tablet (1 g total) by mouth 4 (four) times daily -  with meals and at bedtime.      Follow-up Information    [facility] Follow up.   Why:  assist with TPN Contact information: [address] [place] [address] [phone]             The results of significant diagnostics from this hospitalization (including imaging, microbiology, ancillary and laboratory) are listed below for reference.    Significant Diagnostic Studies: Dg Ugi W/high Density W/kub  Result Date: 08/02/2016 CLINICAL DATA:  Post gastric sleeve bariatric surgery in October. Feeling of nausea. EXAM: UPPER GI SERIES WITH KUB TECHNIQUE: After obtaining a scout radiograph a routine upper GI series was performed using thin barium FLUOROSCOPY TIME:  Fluoroscopy Time:  2.4 Radiation Exposure Index (if provided by the fluoroscopic device): 97.8 Number of Acquired Spot Images: 0 COMPARISON:  Upper GI 11/30/2015 FINDINGS: Initial KUB demonstrates no bowel obstruction. Posterior lumbar fusion. Surgical clips in the epigastric region. Initial small sips of barium were taken. These small sips elicited reflex vomiting of a small content. Subsequent sips did pass into the stomach. There is no stricture mass or identified in the  esophagus. Some sips of barium passed easily through the GE junction while others elicited a rapid gag reflex with vomiting. The stomach appears normal without evidence obstruction. The sleeve is normal without leak or obstruction. Contrast passes into the second, third, and fourth portion of the duodenum without obstruction. A barium tablet initially lodged in the upper thoracic esophagus. With multiple sips of water the tablet ultimately passed in stomach. Patient had extreme nausea towards the end of the exam. IMPRESSION: 1. While there is no stricture or mass evident in the esophagus, small sips of barium elicited a rapid reflex vomiting response as contrast passed the esophagus. Other times contrast passed easily into the esophagus without event. 2. Barium tablet remained in the proximal esophagus for several minutes before passing into the stomach. 3. Patient was extremely nauseous during exam with vomiting 4. Stomach appears normal. Normal gastric  sleeve. Normal duodenum without obstruction. Electronically Signed   By: Suzy Bouchard M.D.   On: 08/02/2016 12:51    Microbiology: No results found for this or any previous visit (from the past 240 hour(s)).   Labs: Basic Metabolic Panel:  Recent Labs Lab 08/02/16 1716 08/06/16 1429 08/07/16 0602 08/08/16 0432  NA 140 143 141 140  K 3.3* 3.4* 3.1* 3.2*  CL 105 106 105 109  CO2 25 26 30 29   GLUCOSE 94 65 112* 141*  BUN 6 <5* <5* <5*  CREATININE 0.65 0.66 0.62 0.46  CALCIUM 8.1* 8.1* 8.0* 8.4*  MG 1.7 1.6*  --  2.0  PHOS 2.2* 2.9  --  2.0*   Liver Function Tests:  Recent Labs Lab 08/02/16 1716 08/06/16 1429 08/08/16 0432  AST 100* 88* 84*  ALT 67* 53 52  ALKPHOS 74 64 59  BILITOT 1.4* 1.2 0.7  PROT 6.1* 5.2* 4.9*  ALBUMIN 3.1* 2.6* 2.5*   No results for input(s): LIPASE, AMYLASE in the last 168 hours. No results for input(s): AMMONIA in the last 168 hours. CBC:  Recent Labs Lab 08/02/16 1716 08/06/16 1429 08/08/16 0432  WBC 5.7 4.3 4.5  NEUTROABS 3.9  --  2.3  HGB 13.6 12.5 11.9*  HCT 37.5 35.3* 34.1*  MCV 83.1 83.3 86.5  PLT 197 172 144*   Cardiac Enzymes: No results for input(s): CKTOTAL, CKMB, CKMBINDEX, TROPONINI in the last 168 hours. BNP: BNP (last 3 results) No results for input(s): BNP in the last 8760 hours.  ProBNP (last 3 results) No results for input(s): PROBNP in the last 8760 hours.  CBG:  Recent Labs Lab 08/07/16 0632 08/07/16 1754 08/07/16 2349 08/08/16 0608 08/08/16 1153  GLUCAP 94 120* 138* 131* 125*    Active Problems:   Malnutrition ([place])   Time coordinating discharge: 30 minutes  Signed:  Gayland Curry, MD [facility] [place] [phone] 08/09/2016, 10:51 AM

## 2017-08-09 IMAGING — CT CT ABD-PELV W/ CM
2 of 4 series · 17 of 46 positions shown, 19 images · IV contrast (ISOVUE)
Comparison: None.

CLINICAL DATA: Vomiting.

EXAM:
CT ABDOMEN AND PELVIS WITH CONTRAST
TECHNIQUE: Multidetector CT imaging of the abdomen and pelvis was performed
using the standard protocol following bolus administration of
intravenous contrast.
CONTRAST:  100mL 5PPX69-GOO IOPAMIDOL (5PPX69-GOO) INJECTION 61%

[Series 2: abd/pel with · axial · 0.77mm/px · z∈[+1207,+1617]mm · 14 of 94 slices shown, 16 images]
[im 6/94  soft-tissue]
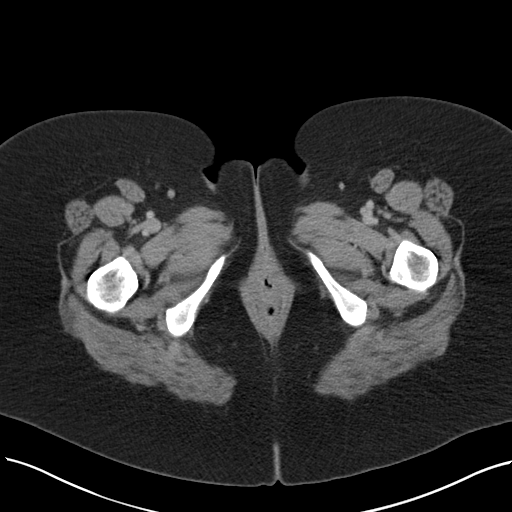
[im 6/94  bone]
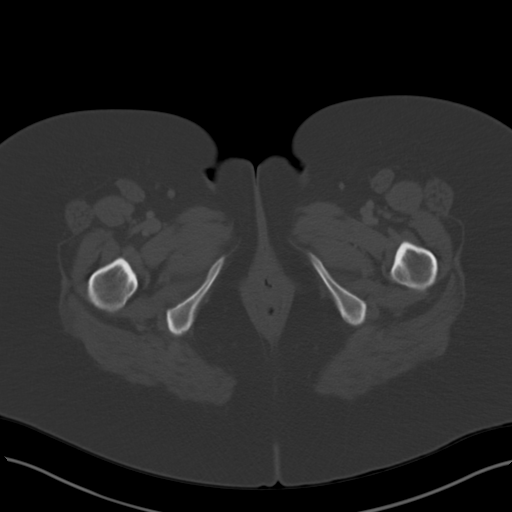
[im 11/94  soft-tissue]
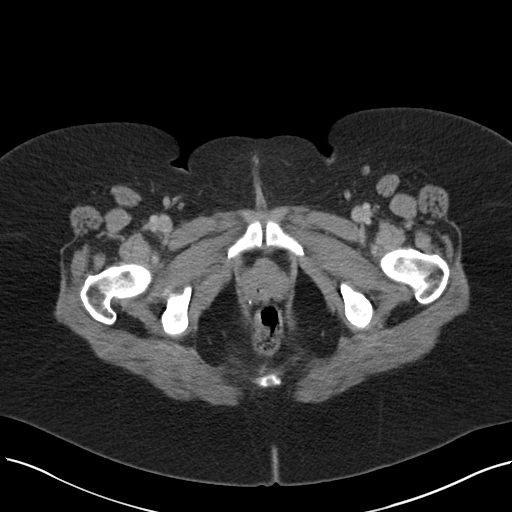
[im 17/94  soft-tissue]
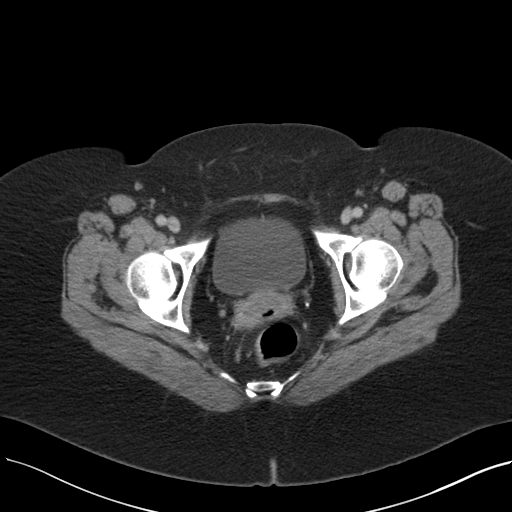
[im 28/94  soft-tissue]
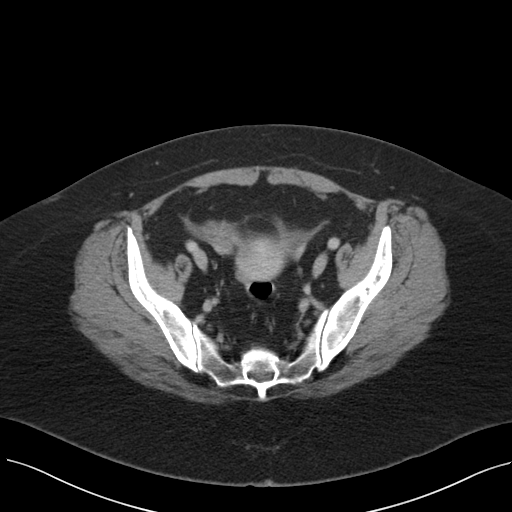
[im 33/94  soft-tissue]
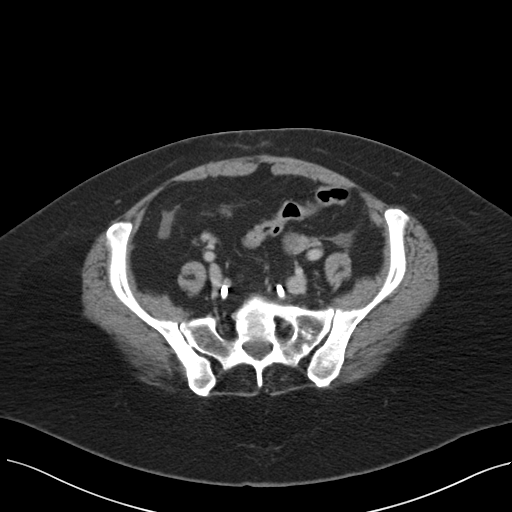
[im 39/94  soft-tissue]
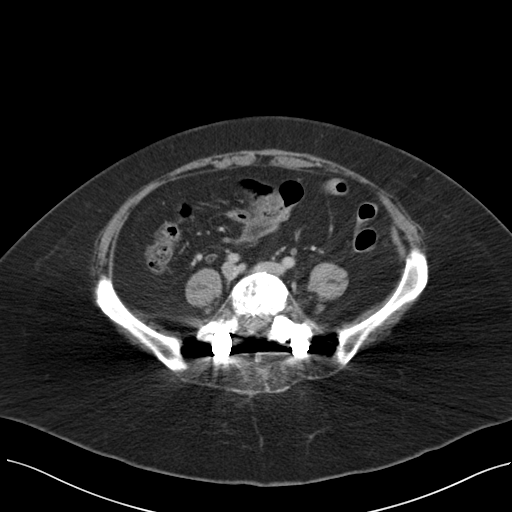
[im 44/94  soft-tissue]
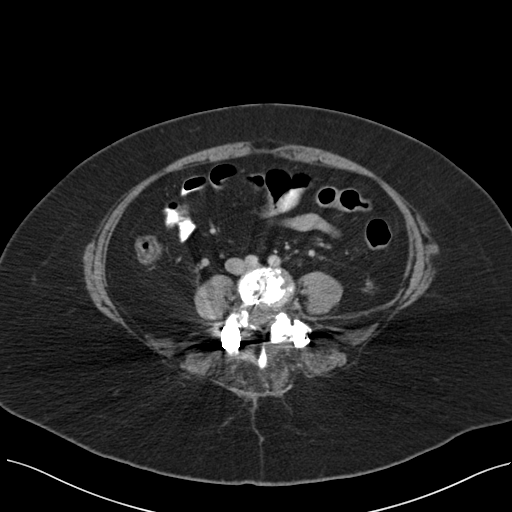
[im 50/94  soft-tissue]
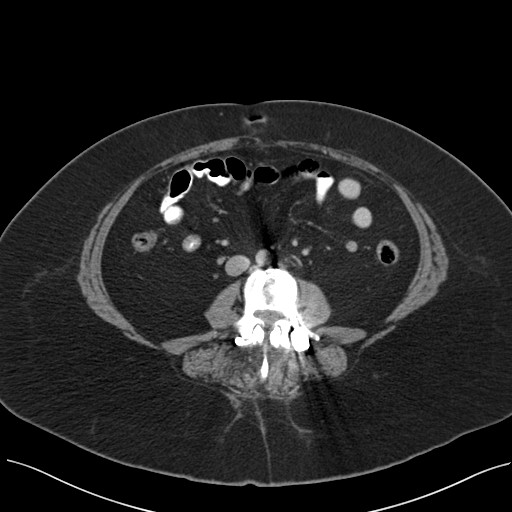
[im 55/94  soft-tissue]
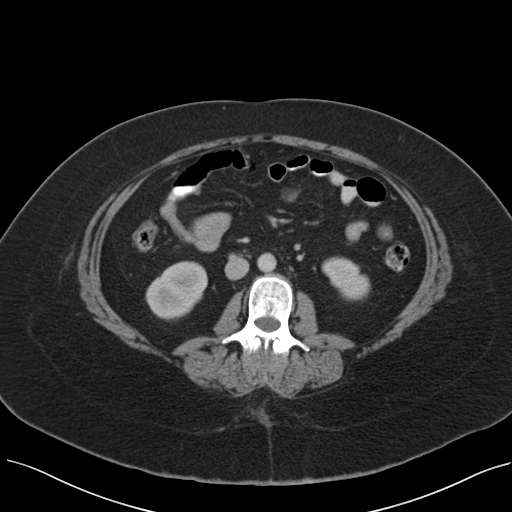
[im 55/94  bone]
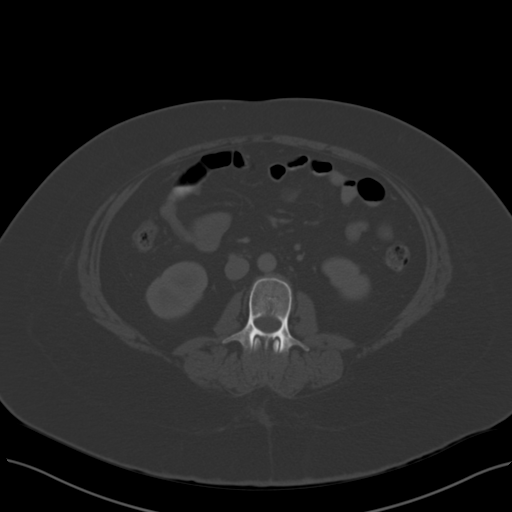
[im 61/94  soft-tissue]
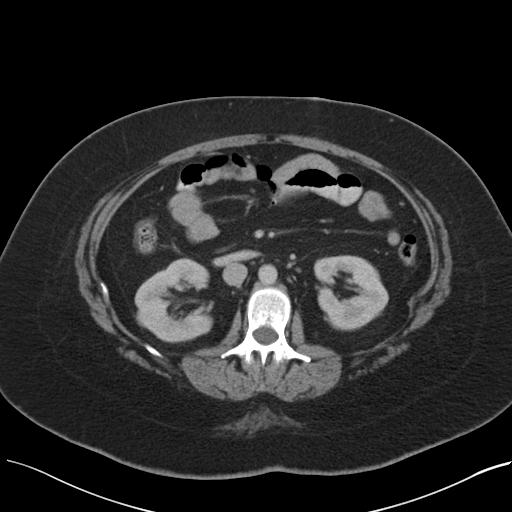
[im 72/94  soft-tissue]
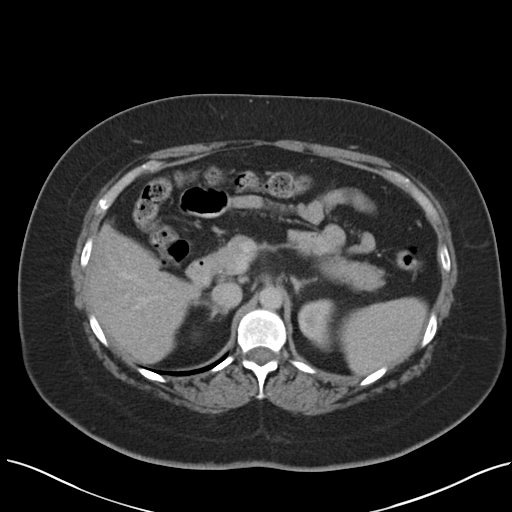
[im 77/94  soft-tissue]
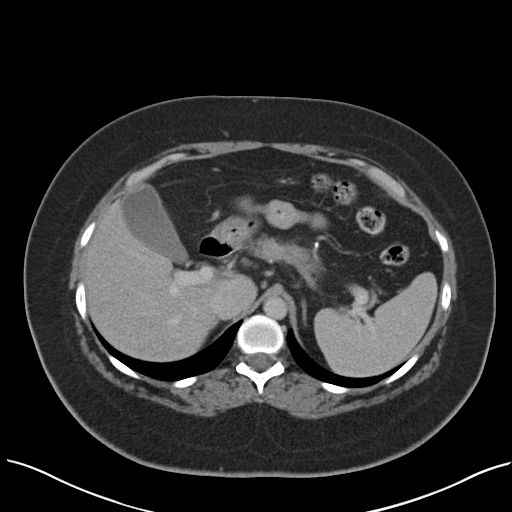
[im 83/94  soft-tissue]
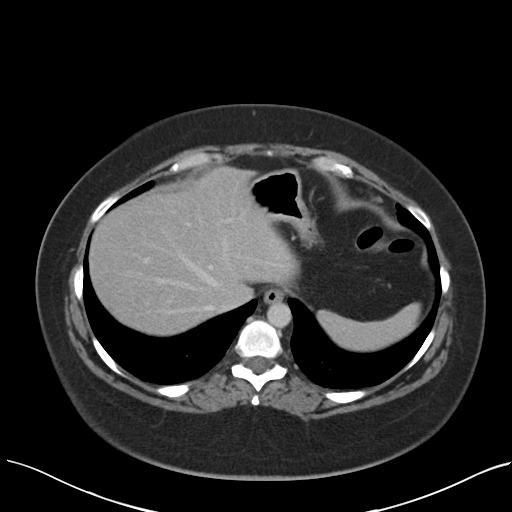
[im 88/94  soft-tissue]
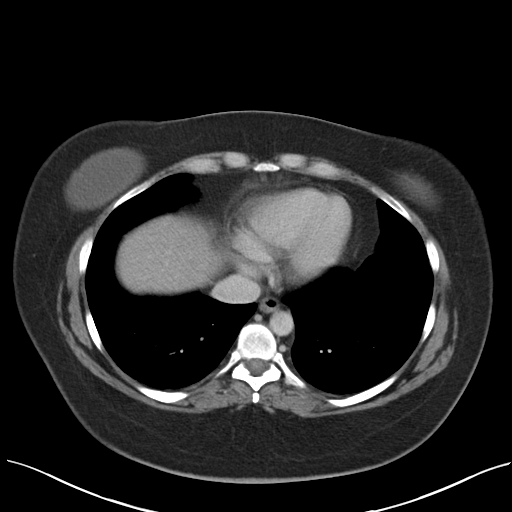

[Series 3: coronal a/|p · coronal · 0.85mm/px · 3 of 115 slices shown]
[im 39/115  soft-tissue]
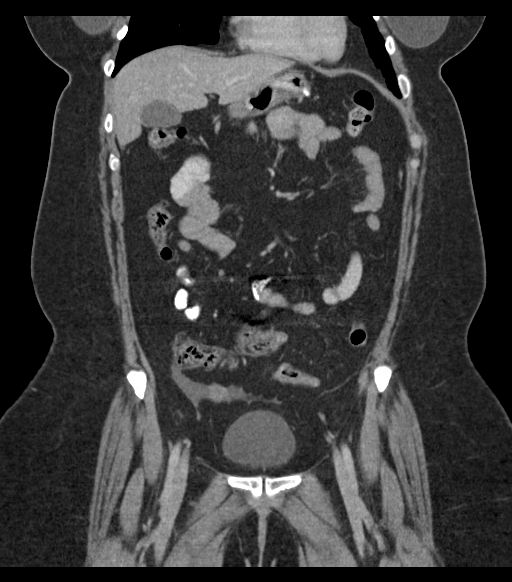
[im 51/115  soft-tissue]
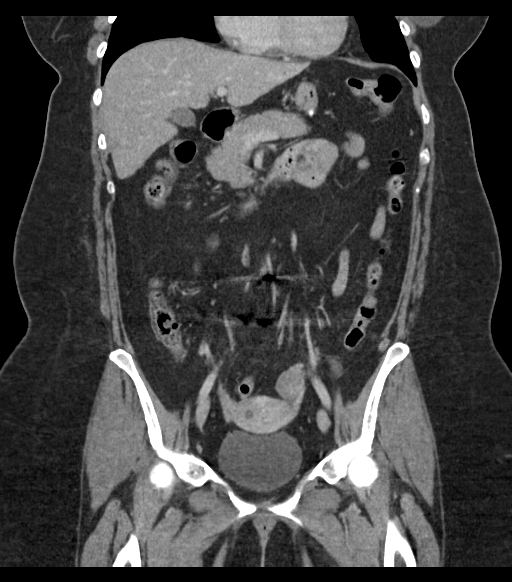
[im 64/115  soft-tissue]
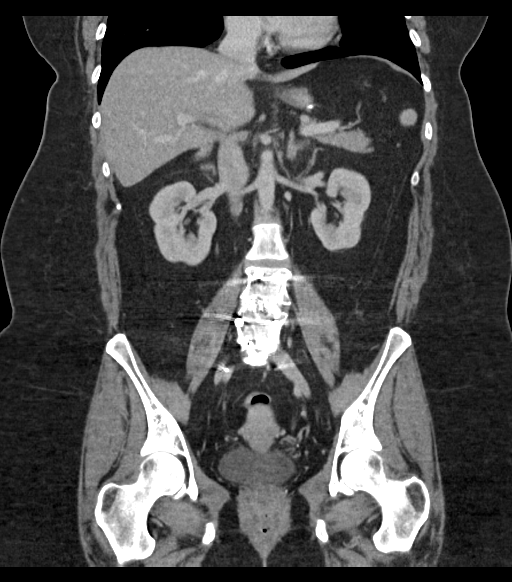

[17 of 46 positions shown; findings below may reference images not displayed]

FINDINGS: Lower chest: Visualized lung bases are unremarkable.

Hepatobiliary: No gallstones are noted.  Liver appears normal.

Pancreas: Normal.

Spleen: Normal.

Adrenals/Urinary Tract: Adrenal glands and kidneys appear normal. No
hydronephrosis or renal obstruction is noted. No renal or ureteral
calculi are noted. Urinary bladder appears normal.

Stomach/Bowel: Status post gastric surgery. There is no evidence of
bowel obstruction. The appendix appears normal.

Vascular/Lymphatic: No significant adenopathy is noted.

Reproductive: Uterus and ovaries are unremarkable.

Other: Small amount of free fluid is noted in the adnexal regions
bilaterally which most likely is physiologic.

Musculoskeletal: Status post surgical posterior fusion of the L4
through S1.
IMPRESSION: Status post gastric surgery. Postsurgical changes seen in lumbar
spine. No acute abnormality seen in the abdomen or pelvis.

## 2017-08-10 IMAGING — NM NM HEPATO W/GB/PHARM/[PERSON_NAME]
2 series · 12 of 12 positions shown · non-contrast
Comparison: CT scan 06/12/2016

CLINICAL DATA: Right upper quadrant abdominal pain and vomiting.
History of gastric sleeve surgery 05/21/2016

EXAM:
NUCLEAR MEDICINE HEPATOBILIARY IMAGING WITH GALLBLADDER EF
TECHNIQUE: Sequential images of the abdomen were obtained [DATE] minutes
following intravenous administration of radiopharmaceutical. After
slow intravenous infusion of 2.03 micrograms Cholecystokinin,
gallbladder ejection fraction was determined.
RADIOPHARMACEUTICALS:  5.42 mCi 0c-KKm Choletec IV

[Series 1: biliary · 3.25mm/px · 6 of 60 frames shown]
[frame 6/60]
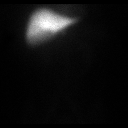
[frame 16/60]
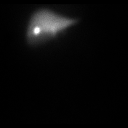
[frame 26/60]
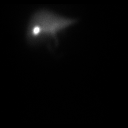
[frame 36/60]
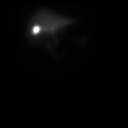
[frame 46/60]
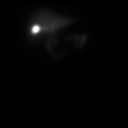
[frame 56/60]
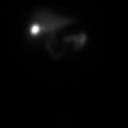

[Series 2: gbef · 3.25mm/px · 6 of 60 frames shown]
[frame 6/60]
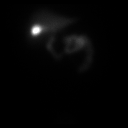
[frame 16/60]
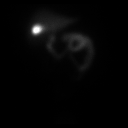
[frame 26/60]
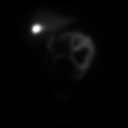
[frame 36/60]
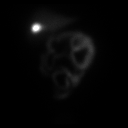
[frame 46/60]
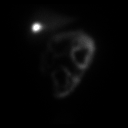
[frame 56/60]
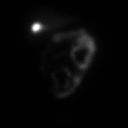

[12 of 12 positions shown; findings below may reference images not displayed]

FINDINGS: Symmetric uptake of the radiopharmaceutical in the liver and prompt
excretion into the biliary tree which is visualized at 10 minutes.
The gallbladder is also visualized at 10 minutes. Activity is seen
in the small bowel at 25 minutes.

Calculated gallbladder ejection fraction is 50%. (At 60 min, normal
ejection fraction is greater than 40%.)
IMPRESSION: Normal biliary patency study and gallbladder ejection fraction.

## 2017-12-04 ENCOUNTER — Encounter (HOSPITAL_COMMUNITY): Payer: Self-pay

## 2019-03-02 ENCOUNTER — Encounter (HOSPITAL_COMMUNITY): Payer: Self-pay

## 2019-12-09 ENCOUNTER — Encounter (HOSPITAL_COMMUNITY): Payer: Self-pay

## 2020-12-16 ENCOUNTER — Encounter (HOSPITAL_COMMUNITY): Payer: Self-pay | Admitting: *Deleted

## 2021-12-14 ENCOUNTER — Encounter (HOSPITAL_COMMUNITY): Payer: Self-pay | Admitting: *Deleted
